# Patient Record
Sex: Male | Born: 1990 | Race: Black or African American | Hispanic: No | Marital: Single | Smoking: Never smoker
Health system: Southern US, Community
[De-identification: ages and names within clinical notes are randomized; demographics above are authoritative.]

## PROBLEM LIST (undated history)

## (undated) DIAGNOSIS — J45909 Unspecified asthma, uncomplicated: Secondary | ICD-10-CM

## (undated) DIAGNOSIS — R569 Unspecified convulsions: Secondary | ICD-10-CM

## (undated) DIAGNOSIS — L0591 Pilonidal cyst without abscess: Secondary | ICD-10-CM

## (undated) HISTORY — PX: MANDIBLE FRACTURE SURGERY: SHX706

---
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
## 2010-06-04 LAB — BASIC METABOLIC PANEL
BUN: 7 mg/dL (ref 6–23)
Chloride: 104 mEq/L (ref 96–112)
Creatinine, Ser: 0.95 mg/dL (ref 0.4–1.5)
GFR calc Af Amer: >60 mL/min (ref >60)
Glucose, Bld: 86 mg/dL (ref 70–99)
Potassium: 3.9 mEq/L (ref 3.5–5.1)
Sodium: 138 mEq/L (ref 135–145)

## 2010-06-04 LAB — DIFFERENTIAL
Eosinophils Absolute: 0 10*3/uL (ref 0.0–0.7)
Lymphocytes Relative: 16 % (ref 12–46)
Lymphs Abs: 1.4 10*3/uL (ref 0.7–4.0)
Monocytes Relative: 16 % — ABNORMAL HIGH (ref 3–12)
Neutro Abs: 5.9 10*3/uL (ref 1.7–7.7)
Neutrophils Relative %: 67 % (ref 43–77)

## 2010-06-04 LAB — CBC
Hemoglobin: 13.1 g/dL (ref 13.0–17.0)
Platelets: 193 10*3/uL (ref 150–400)
RDW: 15.7 % — ABNORMAL HIGH (ref 11.5–15.5)
WBC: 8.8 10*3/uL (ref 4.0–10.5)

## 2011-10-09 ENCOUNTER — Emergency Department (HOSPITAL_COMMUNITY)
Admission: EM | Admit: 2011-10-09 | Discharge: 2011-10-09 | Disposition: A | Discharge status: Home / Self Care | Payer: Self-pay | Attending: Emergency Medicine | Admitting: Emergency Medicine

## 2011-10-09 ENCOUNTER — Emergency Department (HOSPITAL_COMMUNITY): Payer: Self-pay

## 2011-10-09 ENCOUNTER — Encounter (HOSPITAL_COMMUNITY): Payer: Self-pay | Admitting: Emergency Medicine

## 2011-10-09 DIAGNOSIS — R569 Unspecified convulsions: Secondary | ICD-10-CM | POA: Insufficient documentation

## 2011-10-09 DIAGNOSIS — S60221A Contusion of right hand, initial encounter: Secondary | ICD-10-CM

## 2011-10-09 DIAGNOSIS — S60229A Contusion of unspecified hand, initial encounter: Secondary | ICD-10-CM | POA: Insufficient documentation

## 2011-10-09 DIAGNOSIS — J45909 Unspecified asthma, uncomplicated: Secondary | ICD-10-CM | POA: Insufficient documentation

## 2011-10-09 DIAGNOSIS — W06XXXA Fall from bed, initial encounter: Secondary | ICD-10-CM | POA: Insufficient documentation

## 2011-10-09 HISTORY — DX: Unspecified asthma, uncomplicated: J45.909

## 2011-10-09 LAB — COMPREHENSIVE METABOLIC PANEL
ALT: 21 U/L (ref 0–53)
Alkaline Phosphatase: 69 U/L (ref 39–117)
BUN: 9 mg/dL (ref 6–23)
GFR calc Af Amer: >90 mL/min (ref >90)
Glucose, Bld: 97 mg/dL (ref 70–99)
Potassium: 3.5 mEq/L (ref 3.5–5.1)
Sodium: 141 mEq/L (ref 135–145)
Total Bilirubin: 0.3 mg/dL (ref 0.3–1.2)
Total Protein: 7 g/dL (ref 6.0–8.3)

## 2011-10-09 LAB — CBC WITH DIFFERENTIAL/PLATELET
Basophils Absolute: 0 10*3/uL (ref 0.0–0.1)
Eosinophils Absolute: 0 10*3/uL (ref 0.0–0.7)
Eosinophils Relative: 0 % (ref 0–5)
Hemoglobin: 13.3 g/dL (ref 13.0–17.0)
Lymphocytes Relative: 30 % (ref 12–46)
MCH: 27.1 pg (ref 26.0–34.0)
MCV: 82.7 fL (ref 78.0–100.0)
Monocytes Relative: 7 % (ref 3–12)
Platelets: 219 10*3/uL (ref 150–400)
RBC: 4.91 MIL/uL (ref 4.22–5.81)
WBC: 5.6 10*3/uL (ref 4.0–10.5)

## 2011-10-09 LAB — URINALYSIS, ROUTINE W REFLEX MICROSCOPIC
Glucose, UA: NEGATIVE mg/dL
Urobilinogen, UA: 0.2 mg/dL (ref 0.0–1.0)
pH: 6 (ref 5.0–8.0)

## 2011-10-09 LAB — ETHANOL: Alcohol, Ethyl (B): 103 mg/dL — ABNORMAL HIGH (ref 0–11)

## 2011-10-09 LAB — RAPID URINE DRUG SCREEN, HOSP PERFORMED
Benzodiazepines: NOT DETECTED
Opiates: NOT DETECTED
Tetrahydrocannabinol: POSITIVE — AB

## 2011-10-09 NOTE — ED Notes (Signed)
FSBS 116 PTA per EMS.
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
## 2011-10-09 NOTE — ED Notes (Signed)
Seizure pads applied to bed rails

## 2011-10-09 NOTE — ED Notes (Signed)
Report given to Jenny, receiving RN. 

## 2011-10-09 NOTE — ED Notes (Signed)
Patient transported to X-ray 

## 2011-10-09 NOTE — ED Notes (Signed)
MD at bedside. 

## 2011-10-09 NOTE — ED Notes (Signed)
21/M brought to ER by EMS for c/o grand mal seizure x 30 min PTA that lasted 2 minutes per EMS. Seizure witnessed by family per EMS. Pt AAO x 4, NAD at this time and breathing even and unlabored. Upon arrival by EMS pt was unresponsive to painful stimuli per EMS and thrashing around per EMS. Pt's significant other at bedside. Pt on cardiac monitor and SPO2 monitor. Pt has small hematoma on back left side of head s/p falling out of bed after seizure per EMS. Pt was placed back into bed by family members per EMS. Will continue to monitor pt.

## 2011-10-09 NOTE — ED Notes (Signed)
Pt has no history of seizures, twin sister has history of seizures per pt.

## 2011-10-09 NOTE — ED Notes (Signed)
CBG completed. CBG=93

## 2013-11-23 ENCOUNTER — Emergency Department (HOSPITAL_COMMUNITY)
Admission: EM | Admit: 2013-11-23 | Discharge: 2013-11-23 | Disposition: A | Discharge status: Home / Self Care | Payer: Self-pay | Attending: Emergency Medicine | Admitting: Emergency Medicine

## 2013-11-23 ENCOUNTER — Encounter (HOSPITAL_COMMUNITY): Payer: Self-pay | Admitting: Emergency Medicine

## 2013-11-23 DIAGNOSIS — S60469A Insect bite (nonvenomous) of unspecified finger, initial encounter: Secondary | ICD-10-CM | POA: Insufficient documentation

## 2013-11-23 DIAGNOSIS — J45909 Unspecified asthma, uncomplicated: Secondary | ICD-10-CM | POA: Insufficient documentation

## 2013-11-23 DIAGNOSIS — Y9389 Activity, other specified: Secondary | ICD-10-CM | POA: Insufficient documentation

## 2013-11-23 DIAGNOSIS — S60569A Insect bite (nonvenomous) of unspecified hand, initial encounter: Principal | ICD-10-CM

## 2013-11-23 DIAGNOSIS — L089 Local infection of the skin and subcutaneous tissue, unspecified: Secondary | ICD-10-CM | POA: Insufficient documentation

## 2013-11-23 DIAGNOSIS — Y9289 Other specified places as the place of occurrence of the external cause: Secondary | ICD-10-CM | POA: Insufficient documentation

## 2013-11-23 DIAGNOSIS — W57XXXA Bitten or stung by nonvenomous insect and other nonvenomous arthropods, initial encounter: Secondary | ICD-10-CM | POA: Insufficient documentation

## 2013-11-23 DIAGNOSIS — F17200 Nicotine dependence, unspecified, uncomplicated: Secondary | ICD-10-CM | POA: Insufficient documentation

## 2013-11-23 MED ORDER — CEPHALEXIN 500 MG PO CAPS: 500.0000 mg | ORAL_CAPSULE | Freq: Four times a day (QID) | ORAL | Status: DC

## 2013-11-23 MED ORDER — IBUPROFEN 800 MG PO TABS: 800.0000 mg | ORAL_TABLET | Freq: Three times a day (TID) | ORAL | Status: DC

## 2013-11-23 MED ORDER — DIPHENHYDRAMINE HCL 25 MG PO TABS: 25.0000 mg | ORAL_TABLET | Freq: Four times a day (QID) | ORAL | Status: DC | PRN

## 2013-11-23 MED ORDER — DIPHENHYDRAMINE HCL 25 MG PO CAPS
50.0000 mg | ORAL_CAPSULE | Freq: Once | ORAL | Status: AC
  Administered 2013-11-23: 50 mg via ORAL
  Filled 2013-11-23: qty 2

## 2013-11-23 MED ORDER — IBUPROFEN 400 MG PO TABS
800.0000 mg | ORAL_TABLET | Freq: Once | ORAL | Status: AC
  Administered 2013-11-23: 800 mg via ORAL
  Filled 2013-11-23: qty 2

## 2013-11-23 NOTE — Discharge Planning (Signed)
[facility]ia[facility] was unable to see the patient, [facility] information will be sent to the address provided.

## 2013-11-23 NOTE — Discharge Instructions (Signed)

## 2013-11-23 NOTE — ED Notes (Signed)
Pt states he put on a work glove last pm, felt a "burning sensation" to right right finger at knuckle. Took off glove and noticed "burn mark" on knuckle. Now finger and hand are swollen and tender.

## 2013-11-23 NOTE — ED Provider Notes (Signed)
Medical screening examination/treatment/procedure(s) were performed by non-physician practitioner and as supervising physician I was immediately available for consultation/collaboration.   EKG Interpretation None       Miraj Truss, MD 11/23/13 1233 

## 2013-11-23 NOTE — ED Provider Notes (Signed)
CSN: [id number]     Arrival date & time 11/23/13  0904 History  This chart was scribed for non-physician practitioner, Roxy Horseman, PA-C working with Vanetta Mulders, MD by Greggory Stallion, ED scribe. This patient was seen in room TR05C/TR05C and the patient's care was started at 9:14 AM.    Chief Complaint  Patient presents with  . Hand Pain   The history is provided by the patient. No language interpreter was used.   HPI Comments: Alejandro Elliott is a 23 y.o. male who presents to the Emergency Department complaining of worsening right fourth finger pain with associated swelling that goes into his hand that started last night. Reports mild itching. Pt states he went to put his work glove on and felt a burning sensation to his knuckle. He thinks he might have gotten bit by a spider but is unsure. No SOB or chest pain. Denies history of similar symptoms.   Past Medical History  Diagnosis Date  . Asthma    Past Surgical History  Procedure Laterality Date  . Mandible fracture surgery     Family History  Problem Relation Age of Onset  . Seizures Sister    History  Substance Use Topics  . Smoking status: Current Every Day Smoker  . Smokeless tobacco: Never Used  . Alcohol Use: Yes    Review of Systems  Constitutional: Negative for fever.  HENT: Negative for congestion.   Eyes: Negative for redness.  Respiratory: Negative for shortness of breath.   Cardiovascular: Negative for chest pain.  Gastrointestinal: Negative for abdominal distention.  Musculoskeletal: Positive for arthralgias and joint swelling.  Skin: Negative for rash.  Neurological: Negative for speech difficulty.  Psychiatric/Behavioral: Negative for confusion.   Allergies  Review of patient's allergies indicates no known allergies.  Home Medications   Prior to Admission medications   Medication Sig Start Date End Date Taking? Authorizing Provider  albuterol (PROVENTIL HFA;VENTOLIN HFA) 108 (90 BASE) MCG/ACT  inhaler Inhale 2 puffs into the lungs every 6 (six) hours as needed.    Historical Provider, MD   BP 132/80  Pulse 66  Temp(Src) 97.6 F (36.4 C) (Oral)  Resp 20  SpO2 100%  Physical Exam  Nursing note and vitals reviewed. Constitutional: He is oriented to person, place, and time. He appears well-developed. No distress.  HENT:  Head: Normocephalic and atraumatic.  Eyes: Conjunctivae and EOM are normal.  Cardiovascular: Normal rate and regular rhythm.   Pulmonary/Chest: Effort normal. No stridor. No respiratory distress.  Abdominal: He exhibits no distension.  Musculoskeletal: He exhibits no edema.  Right ring finger and right hand moderately swollen. Non tender to palpation. No bony abnormality or deformity. Right finger flexion is limited secondary to swelling. Extension is intact.   Neurological: He is alert and oriented to person, place, and time.  Sensation and strength intact.  Skin: Skin is warm and dry.  No obvious punctures or wounds. No sign of cellulitis.   Psychiatric: He has a normal mood and affect.    ED Course  Procedures (including critical care time)  DIAGNOSTIC STUDIES: Oxygen Saturation is 100% on RA, normal by my interpretation.    COORDINATION OF CARE: 9:19 AM-Discussed treatment plan which includes benadryl, an anti-inflammatory and an antibiotic with pt at bedside and pt agreed to plan. Advised ice and elevation. Return precautions given.   Labs Review Labs Reviewed - No data to display  Imaging Review No results found.   EKG Interpretation None  MDM   Final diagnoses:  Insect bite    Patient with insect bite.  Describes it as a stinging/burning.  Bees were present at the time of the bite.  Suspect bee sting, but patient believes it was a spider.  No obvious puncture wounds or ulcerations.  Will treat with benadryl, nsaids, and give keflex for prophylaxis.  I personally performed the services described in this documentation, which was  scribed in my presence. The recorded information has been reviewed and is accurate.  Roxy Horseman, PA-C 11/23/13 [phone]

## 2013-11-28 ENCOUNTER — Encounter (HOSPITAL_COMMUNITY): Payer: Self-pay | Admitting: Emergency Medicine

## 2013-11-28 ENCOUNTER — Emergency Department (HOSPITAL_COMMUNITY): Payer: Self-pay

## 2013-11-28 ENCOUNTER — Emergency Department (HOSPITAL_COMMUNITY)
Admission: EM | Admit: 2013-11-28 | Discharge: 2013-11-28 | Disposition: A | Discharge status: Home / Self Care | Payer: Self-pay | Attending: Emergency Medicine | Admitting: Emergency Medicine

## 2013-11-28 DIAGNOSIS — Z791 Long term (current) use of non-steroidal anti-inflammatories (NSAID): Secondary | ICD-10-CM | POA: Insufficient documentation

## 2013-11-28 DIAGNOSIS — S139XXA Sprain of joints and ligaments of unspecified parts of neck, initial encounter: Secondary | ICD-10-CM | POA: Insufficient documentation

## 2013-11-28 DIAGNOSIS — R071 Chest pain on breathing: Secondary | ICD-10-CM | POA: Insufficient documentation

## 2013-11-28 DIAGNOSIS — F17200 Nicotine dependence, unspecified, uncomplicated: Secondary | ICD-10-CM | POA: Insufficient documentation

## 2013-11-28 DIAGNOSIS — Z79899 Other long term (current) drug therapy: Secondary | ICD-10-CM | POA: Insufficient documentation

## 2013-11-28 DIAGNOSIS — J45909 Unspecified asthma, uncomplicated: Secondary | ICD-10-CM | POA: Insufficient documentation

## 2013-11-28 DIAGNOSIS — Z792 Long term (current) use of antibiotics: Secondary | ICD-10-CM | POA: Insufficient documentation

## 2013-11-28 DIAGNOSIS — R0789 Other chest pain: Secondary | ICD-10-CM

## 2013-11-28 DIAGNOSIS — S161XXA Strain of muscle, fascia and tendon at neck level, initial encounter: Secondary | ICD-10-CM

## 2013-11-28 DIAGNOSIS — S0993XA Unspecified injury of face, initial encounter: Secondary | ICD-10-CM | POA: Insufficient documentation

## 2013-11-28 DIAGNOSIS — S199XXA Unspecified injury of neck, initial encounter: Secondary | ICD-10-CM

## 2013-11-28 MED ORDER — IBUPROFEN 600 MG PO TABS: 600.0000 mg | ORAL_TABLET | Freq: Four times a day (QID) | ORAL | Status: DC | PRN

## 2013-11-28 MED ORDER — HYDROCODONE-ACETAMINOPHEN 5-325 MG PO TABS: 1.0000 | ORAL_TABLET | Freq: Four times a day (QID) | ORAL | Status: DC | PRN

## 2013-11-28 MED ORDER — KETOROLAC TROMETHAMINE 60 MG/2ML IM SOLN
60.0000 mg | Freq: Once | INTRAMUSCULAR | Status: AC
  Administered 2013-11-28: 60 mg via INTRAMUSCULAR
  Filled 2013-11-28: qty 2

## 2013-11-28 NOTE — Discharge Instructions (Signed)
Cervical Sprain A cervical sprain is when the tissues (ligaments) that hold the neck bones in place stretch or tear. HOME CARE   Put ice on the injured area.  Put ice in a plastic bag.  Place a towel between your skin and the bag.  Leave the ice on for 15-20 minutes, 3-4 times a day.  You may have been given a collar to wear. This collar keeps your neck from moving while you heal.  Do not take the collar off unless told by your doctor.  If you have long hair, keep it outside of the collar.  Ask your doctor before changing the position of your collar. You may need to change its position over time to make it more comfortable.  If you are allowed to take off the collar for cleaning or bathing, follow your doctor's instructions on how to do it safely.  Keep your collar clean by wiping it with mild soap and water. Dry it completely. If the collar has removable pads, remove them every 1-2 days to hand wash them with soap and water. Allow them to air dry. They should be dry before you wear them in the collar.  Do not drive while wearing the collar.  Only take medicine as told by your doctor.  Keep all doctor visits as told.  Keep all physical therapy visits as told.  Adjust your work station so that you have good posture while you work.  Avoid positions and activities that make your problems worse.  Warm up and stretch before being active. GET HELP IF:  Your pain is not controlled with medicine.  You cannot take less pain medicine over time as planned.  Your activity level does not improve as expected. GET HELP RIGHT AWAY IF:   You are bleeding.  Your stomach is upset.  You have an allergic reaction to your medicine.  You develop new problems that you cannot explain.  You lose feeling (become numb) or you cannot move any part of your body (paralysis).  You have tingling or weakness in any part of your body.  Your symptoms get worse. Symptoms include:  Pain,  soreness, stiffness, puffiness (swelling), or a burning feeling in your neck.  Pain when your neck is touched.  Shoulder or upper back pain.  Limited ability to move your neck.  Headache.  Dizziness.  Your hands or arms feel week, lose feeling, or tingle.  Muscle spasms.  Difficulty swallowing or chewing. MAKE SURE YOU:   Understand these instructions.  Will watch your condition.  Will get help right away if you are not doing well or get worse. Document Released: 08/20/2007 Document Revised: 11/03/2012 Document Reviewed: 09/08/2012 [facility] Patient Information 2015 [place], [place]. This information is not intended to replace advice given to you by your health care provider. Make sure you discuss any questions you have with your health care provider. Chest Wall Pain Chest wall pain is pain in or around the bones and muscles of your chest. It may take up to 6 weeks to get better. It may take longer if you must stay physically active in your work and activities.  CAUSES  Chest wall pain may happen on its own. However, it may be caused by:  A viral illness like the flu.  Injury.  Coughing.  Exercise.  Arthritis.  Fibromyalgia.  Shingles. HOME CARE INSTRUCTIONS   Avoid overtiring physical activity. Try not to strain or perform activities that cause pain. This includes any activities using your chest  or your abdominal and side muscles, especially if heavy weights are used.  Put ice on the sore area.  Put ice in a plastic bag.  Place a towel between your skin and the bag.  Leave the ice on for 15-20 minutes per hour while awake for the first 2 days.  Only take over-the-counter or prescription medicines for pain, discomfort, or fever as directed by your caregiver. SEEK IMMEDIATE MEDICAL CARE IF:   Your pain increases, or you are very uncomfortable.  You have a fever.  Your chest pain becomes worse.  You have new, unexplained symptoms.  You have nausea or  vomiting.  You feel sweaty or lightheaded.  You have a cough with phlegm (sputum), or you cough up blood. MAKE SURE YOU:   Understand these instructions.  Will watch your condition.  Will get help right away if you are not doing well or get worse. Document Released: 03/03/2005 Document Revised: 05/26/2011 Document Reviewed: 10/28/2010 [facility] Patient Information 2015 [place], [place]. This information is not intended to replace advice given to you by your health care provider. Make sure you discuss any questions you have with your health care provider.
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
## 2013-12-11 ENCOUNTER — Emergency Department (HOSPITAL_COMMUNITY): Payer: Self-pay

## 2013-12-11 ENCOUNTER — Emergency Department (HOSPITAL_COMMUNITY)
Admission: EM | Admit: 2013-12-11 | Discharge: 2013-12-11 | Disposition: A | Discharge status: Home / Self Care | Payer: Self-pay | Attending: Emergency Medicine | Admitting: Emergency Medicine

## 2013-12-11 ENCOUNTER — Encounter (HOSPITAL_COMMUNITY): Payer: Self-pay | Admitting: Emergency Medicine

## 2013-12-11 DIAGNOSIS — Z79899 Other long term (current) drug therapy: Secondary | ICD-10-CM | POA: Insufficient documentation

## 2013-12-11 DIAGNOSIS — R079 Chest pain, unspecified: Secondary | ICD-10-CM | POA: Insufficient documentation

## 2013-12-11 DIAGNOSIS — J45909 Unspecified asthma, uncomplicated: Secondary | ICD-10-CM | POA: Insufficient documentation

## 2013-12-11 DIAGNOSIS — F17200 Nicotine dependence, unspecified, uncomplicated: Secondary | ICD-10-CM | POA: Insufficient documentation

## 2013-12-11 DIAGNOSIS — R071 Chest pain on breathing: Secondary | ICD-10-CM | POA: Insufficient documentation

## 2013-12-11 DIAGNOSIS — Z792 Long term (current) use of antibiotics: Secondary | ICD-10-CM | POA: Insufficient documentation

## 2013-12-11 DIAGNOSIS — R0789 Other chest pain: Secondary | ICD-10-CM

## 2013-12-11 MED ORDER — TRAMADOL HCL 50 MG PO TABS: 50.0000 mg | ORAL_TABLET | Freq: Four times a day (QID) | ORAL | Status: DC | PRN

## 2013-12-11 MED ORDER — IBUPROFEN 800 MG PO TABS
800.0000 mg | ORAL_TABLET | Freq: Three times a day (TID) | ORAL | Status: DC
Start: 2013-12-11 — End: 2013-12-16
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
## 2013-12-11 NOTE — Discharge Instructions (Signed)
Please follow up with your primary care physician in 1-2 days. If you do not have one please call the [facility] number listed above. Please take pain medication and/or muscle relaxants as prescribed and as needed for pain. Please do not drive on narcotic pain medication or on muscle relaxants. Please read all discharge instructions and return precautions.  ° ° °Chest Wall Pain °Chest wall pain is pain in or around the bones and muscles of your chest. It may take up to 6 weeks to get better. It may take longer if you must stay physically active in your work and activities.  °CAUSES  °Chest wall pain may happen on its own. However, it may be caused by: °· A viral illness like the flu. °· Injury. °· Coughing. °· Exercise. °· Arthritis. °· Fibromyalgia. °· Shingles. °HOME CARE INSTRUCTIONS  °· Avoid overtiring physical activity. Try not to strain or perform activities that cause pain. This includes any activities using your chest or your abdominal and side muscles, especially if heavy weights are used. °· Put ice on the sore area. °¨ Put ice in a plastic bag. °¨ Place a towel between your skin and the bag. °¨ Leave the ice on for 15-20 minutes per hour while awake for the first 2 days. °· Only take over-the-counter or prescription medicines for pain, discomfort, or fever as directed by your caregiver. °SEEK IMMEDIATE MEDICAL CARE IF:  °· Your pain increases, or you are very uncomfortable. °· You have a fever. °· Your chest pain becomes worse. °· You have new, unexplained symptoms. °· You have nausea or vomiting. °· You feel sweaty or lightheaded. °· You have a cough with phlegm (sputum), or you cough up blood. °MAKE SURE YOU:  °· Understand these instructions. °· Will watch your condition. °· Will get help right away if you are not doing well or get worse. °Document Released: 03/03/2005 Document Revised: 05/26/2011 Document Reviewed: 10/28/2010 °ExitCare® Patient Information ©2015 [employer].  This information is not intended to replace advice given to you by your health care provider. Make sure you discuss any questions you have with your health care provider. ° °

## 2013-12-11 NOTE — ED Provider Notes (Signed)
Medical screening examination/treatment/procedure(s) were performed by non-physician practitioner and as supervising physician I was immediately available for consultation/collaboration.   EKG Interpretation None        Dorena Dorfman F Tayquan Gassman, MD 12/11/13 1943 
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
## 2013-12-16 ENCOUNTER — Encounter (HOSPITAL_COMMUNITY): Payer: Self-pay | Admitting: Emergency Medicine

## 2013-12-16 ENCOUNTER — Emergency Department (HOSPITAL_COMMUNITY)
Admission: EM | Admit: 2013-12-16 | Discharge: 2013-12-16 | Disposition: A | Discharge status: Home / Self Care | Payer: Self-pay | Attending: Emergency Medicine | Admitting: Emergency Medicine

## 2013-12-16 DIAGNOSIS — L0231 Cutaneous abscess of buttock: Secondary | ICD-10-CM | POA: Insufficient documentation

## 2013-12-16 DIAGNOSIS — J45909 Unspecified asthma, uncomplicated: Secondary | ICD-10-CM | POA: Insufficient documentation

## 2013-12-16 DIAGNOSIS — Z720 Tobacco use: Secondary | ICD-10-CM | POA: Insufficient documentation

## 2013-12-16 DIAGNOSIS — Z792 Long term (current) use of antibiotics: Secondary | ICD-10-CM | POA: Insufficient documentation

## 2013-12-16 DIAGNOSIS — Z79899 Other long term (current) drug therapy: Secondary | ICD-10-CM | POA: Insufficient documentation

## 2013-12-16 MED ORDER — HYDROCODONE-ACETAMINOPHEN 5-325 MG PO TABS: 1.0000 | ORAL_TABLET | ORAL | Status: DC | PRN

## 2013-12-16 MED ORDER — LIDOCAINE-EPINEPHRINE (PF) 2 %-1:200000 IJ SOLN
10.0000 mL | Freq: Once | INTRAMUSCULAR | Status: AC
  Administered 2013-12-16: 10 mL via INTRADERMAL
  Filled 2013-12-16: qty 20

## 2013-12-16 MED ORDER — OXYCODONE-ACETAMINOPHEN 5-325 MG PO TABS
2.0000 | ORAL_TABLET | Freq: Once | ORAL | Status: AC
  Administered 2013-12-16: 2 via ORAL
  Filled 2013-12-16: qty 2

## 2013-12-16 MED ORDER — SULFAMETHOXAZOLE-TRIMETHOPRIM 800-160 MG PO TABS: 1.0000 | ORAL_TABLET | Freq: Two times a day (BID) | ORAL | Status: AC

## 2013-12-16 NOTE — ED Provider Notes (Signed)
CSN: [id number]     Arrival date & time 12/16/13  1248 History  This chart was scribed for non-physician practitioner, Johnnette Gourdobyn Albert, PA-C, working with Warnell Foresterrey Wofford, MD by Charline BillsEssence Howell, ED Scribe. This patient was seen in room TR10C/TR10C and the patient's care was started at 1:33 PM.   Chief Complaint  Patient presents with  . Abscess   The history is provided by the patient. No language interpreter was used.   HPI Comments: Alejandro Elliott is a 23 y.o. male, with a h/o abscesses, who presents to the Emergency Department complaining of growing abscess noted to lower back 3 days ago. He denies fever. Pt reports h/o abscess to the same area which required I&D and antibiotics. Pt states that abscess returns every 2-3 months. Pt has applied warm compresses and bathed with antibacterial soap. No PCP.  Past Medical History  Diagnosis Date  . Asthma    Past Surgical History  Procedure Laterality Date  . Mandible fracture surgery     Family History  Problem Relation Age of Onset  . Seizures Sister    History  Substance Use Topics  . Smoking status: Current Every Day Smoker    Types: Cigarettes  . Smokeless tobacco: Never Used  . Alcohol Use: Yes    Review of Systems  Constitutional: Negative for fever.  Skin:       + Abscess  All other systems reviewed and are negative.  Allergies  Review of patient's allergies indicates no known allergies.  Home Medications   Prior to Admission medications   Medication Sig Start Date End Date Taking? Authorizing Provider  albuterol (PROVENTIL HFA;VENTOLIN HFA) 108 (90 BASE) MCG/ACT inhaler Inhale 2 puffs into the lungs every 6 (six) hours as needed for wheezing or shortness of breath.     Historical Provider, MD  cephALEXin (KEFLEX) 500 MG capsule Take 1 capsule (500 mg total) by mouth 4 (four) times daily. 11/23/13   Roxy Horsemanobert Browning, PA-C  diphenhydrAMINE (BENADRYL) 25 MG tablet Take 1 tablet (25 mg total) by mouth every 6 (six) hours as needed  for itching (Rash). 11/23/13   Roxy Horsemanobert Browning, PA-C  HYDROcodone-acetaminophen (NORCO/VICODIN) 5-325 MG per tablet Take 1 tablet by mouth every 6 (six) hours as needed for moderate pain or severe pain. 11/28/13   Shon Batonourtney F Horton, MD  HYDROcodone-acetaminophen (NORCO/VICODIN) 5-325 MG per tablet Take 1-2 tablets by mouth every 4 (four) hours as needed. 12/16/13   Alejandro Maceobyn M Albert, PA-C  ibuprofen (ADVIL,MOTRIN) 600 MG tablet Take 1 tablet (600 mg total) by mouth every 6 (six) hours as needed. 11/28/13   Shon Batonourtney F Horton, MD  ibuprofen (ADVIL,MOTRIN) 800 MG tablet Take 1 tablet (800 mg total) by mouth 3 (three) times daily. 11/23/13   Roxy Horsemanobert Browning, PA-C  ibuprofen (ADVIL,MOTRIN) 800 MG tablet Take 1 tablet (800 mg total) by mouth 3 (three) times daily. 12/11/13   Jennifer L Piepenbrink, PA-C  sulfamethoxazole-trimethoprim (BACTRIM DS,SEPTRA DS) 800-160 MG per tablet Take 1 tablet by mouth 2 (two) times daily. 12/16/13 12/23/13  Alejandro Maceobyn M Albert, PA-C  traMADol (ULTRAM) 50 MG tablet Take 1 tablet (50 mg total) by mouth every 6 (six) hours as needed. 12/11/13   Jennifer L Piepenbrink, PA-C   Triage Vitals: BP 124/71  Pulse 90  Temp(Src) 98.3 F (36.8 C)  Resp 18  SpO2 99% Physical Exam  Nursing note and vitals reviewed. Constitutional: He is oriented to person, place, and time. He appears well-developed and well-nourished. No distress.  HENT:  Head:  Normocephalic and atraumatic.  Eyes: Conjunctivae and EOM are normal.  Neck: Normal range of motion. Neck supple.  Cardiovascular: Normal rate, regular rhythm and normal heart sounds.   Pulmonary/Chest: Effort normal and breath sounds normal.  Musculoskeletal: Normal range of motion. He exhibits no edema.  Neurological: He is alert and oriented to person, place, and time.  Skin: Skin is warm and dry.     Psychiatric: He has a normal mood and affect. His behavior is normal.   ED Course  Procedures (including critical care time) INCISION AND  DRAINAGE Performed by: Johnnette Gourd Consent: Verbal consent obtained. Risks and benefits: risks, benefits and alternatives were discussed Type: abscess  Body area: right buttock  Anesthesia: local infiltration  Incision was made with a scalpel.  Local anesthetic: lidocaine 2% with epinephrine  Anesthetic total: 2 ml  Complexity: complex Blunt dissection to break up loculations  Drainage: purulent  Drainage amount: large  Packing material: none  Patient tolerance: Patient tolerated the procedure well with no immediate complications.    DIAGNOSTIC STUDIES: Oxygen Saturation is 99% on RA, normal by my interpretation.    COORDINATION OF CARE: 1:35 PM-Discussed treatment plan which includes I&D with pt at bedside and pt agreed to plan.   Labs Review Labs Reviewed - No data to display  Imaging Review No results found.   EKG Interpretation None      MDM   Final diagnoses:  Abscess of right buttock   Patient well-appearing in no apparent distress. Afebrile, vital signs stable. Abscess drained. Will discharge home with pain medication and Bactrim. Infection care/precautions discussed. Resources given for PCP followup. Stable for discharge. Return precautions given. Patient states understanding of treatment care plan and is agreeable.  I personally performed the services described in this documentation, which was scribed in my presence. The recorded information has been reviewed and is accurate.    Alejandro Mace, PA-C 12/16/13 1429

## 2013-12-16 NOTE — Discharge Instructions (Signed)
Take Vicodin for severe pain only. No driving or operating heavy machinery while taking vicodin. This medication may cause drowsiness. Take antibiotic to completion.  Abscess An abscess is an infected area that contains a collection of pus and debris.It can occur in almost any part of the body. An abscess is also known as a furuncle or boil. CAUSES  An abscess occurs when tissue gets infected. This can occur from blockage of oil or sweat glands, infection of hair follicles, or a minor injury to the skin. As the body tries to fight the infection, pus collects in the area and creates pressure under the skin. This pressure causes pain. People with weakened immune systems have difficulty fighting infections and get certain abscesses more often.  SYMPTOMS Usually an abscess develops on the skin and becomes a painful mass that is red, warm, and tender. If the abscess forms under the skin, you may feel a moveable soft area under the skin. Some abscesses break open (rupture) on their own, but most will continue to get worse without care. The infection can spread deeper into the body and eventually into the bloodstream, causing you to feel ill.  DIAGNOSIS  Your caregiver will take your medical history and perform a physical exam. A sample of fluid may also be taken from the abscess to determine what is causing your infection. TREATMENT  Your caregiver may prescribe antibiotic medicines to fight the infection. However, taking antibiotics alone usually does not cure an abscess. Your caregiver may need to make a small cut (incision) in the abscess to drain the pus. In some cases, gauze is packed into the abscess to reduce pain and to continue draining the area. HOME CARE INSTRUCTIONS   Only take over-the-counter or prescription medicines for pain, discomfort, or fever as directed by your caregiver.  If you were prescribed antibiotics, take them as directed. Finish them even if you start to feel better.  If  gauze is used, follow your caregiver's directions for changing the gauze.  To avoid spreading the infection:  Keep your draining abscess covered with a bandage.  Wash your hands well.  Do not share personal care items, towels, or whirlpools with others.  Avoid skin contact with others.  Keep your skin and clothes clean around the abscess.  Keep all follow-up appointments as directed by your caregiver. SEEK MEDICAL CARE IF:   You have increased pain, swelling, redness, fluid drainage, or bleeding.  You have muscle aches, chills, or a general ill feeling.  You have a fever. MAKE SURE YOU:   Understand these instructions.  Will watch your condition.  Will get help right away if you are not doing well or get worse. Document Released: 12/11/2004 Document Revised: 09/02/2011 Document Reviewed: 05/16/2011 [facility] Patient Information 2015 [place], [place]. This information is not intended to replace advice given to you by your health care provider. Make sure you discuss any questions you have with your health care provider.  Abscess Care After An abscess (also called a boil or furuncle) is an infected area that contains a collection of pus. Signs and symptoms of an abscess include pain, tenderness, redness, or hardness, or you may feel a moveable soft area under your skin. An abscess can occur anywhere in the body. The infection may spread to surrounding tissues causing cellulitis. A cut (incision) by the surgeon was made over your abscess and the pus was drained out. Gauze may have been packed into the space to provide a drain that will allow the  cavity to heal from the inside outwards. The boil may be painful for 5 to 7 days. Most people with a boil do not have high fevers. Your abscess, if seen early, may not have localized, and may not have been lanced. If not, another appointment may be required for this if it does not get better on its own or with medications. HOME CARE INSTRUCTIONS     Only take over-the-counter or prescription medicines for pain, discomfort, or fever as directed by your caregiver.  When you bathe, soak and then remove gauze or iodoform packs at least daily or as directed by your caregiver. You may then wash the wound gently with mild soapy water. Repack with gauze or do as your caregiver directs. SEEK IMMEDIATE MEDICAL CARE IF:   You develop increased pain, swelling, redness, drainage, or bleeding in the wound site.  You develop signs of generalized infection including muscle aches, chills, fever, or a general ill feeling.  An oral temperature above 102 F (38.9 C) develops, not controlled by medication. See your caregiver for a recheck if you develop any of the symptoms described above. If medications (antibiotics) were prescribed, take them as directed. Document Released: 09/19/2004 Document Revised: 05/26/2011 Document Reviewed: 05/17/2007 [facility] Patient Information 2015 [place]. This information is not intended to replace advice given to you by your health care provider. Make sure you discuss any questions you have with your health care provider.

## 2013-12-16 NOTE — ED Notes (Signed)
Top of butt has a bump about quarter size has a  Head on it hurts to sit

## 2013-12-16 NOTE — ED Notes (Signed)
Per pt sts 3 days of abscess to lower back area. sts hx of same. Denies drainage.

## 2013-12-17 NOTE — ED Provider Notes (Signed)
Medical screening examination/treatment/procedure(s) were performed by non-physician practitioner and as supervising physician I was immediately available for consultation/collaboration.   Warnell Foresterrey Ethne Jeon, MD 12/17/13 [phone]

## 2014-02-05 ENCOUNTER — Encounter (HOSPITAL_COMMUNITY): Payer: Self-pay | Admitting: Emergency Medicine

## 2014-02-05 ENCOUNTER — Emergency Department (HOSPITAL_COMMUNITY)
Admission: EM | Admit: 2014-02-05 | Discharge: 2014-02-05 | Disposition: A | Discharge status: Home / Self Care | Payer: Self-pay | Attending: Emergency Medicine | Admitting: Emergency Medicine

## 2014-02-05 DIAGNOSIS — L0501 Pilonidal cyst with abscess: Secondary | ICD-10-CM | POA: Insufficient documentation

## 2014-02-05 DIAGNOSIS — Z720 Tobacco use: Secondary | ICD-10-CM | POA: Insufficient documentation

## 2014-02-05 DIAGNOSIS — J45909 Unspecified asthma, uncomplicated: Secondary | ICD-10-CM | POA: Insufficient documentation

## 2014-02-05 DIAGNOSIS — Z79899 Other long term (current) drug therapy: Secondary | ICD-10-CM | POA: Insufficient documentation

## 2014-02-05 MED ORDER — LIDOCAINE-EPINEPHRINE (PF) 2 %-1:200000 IJ SOLN
20.0000 mL | Freq: Once | INTRAMUSCULAR | Status: AC
  Administered 2014-02-05: 10 mL
  Filled 2014-02-05: qty 20

## 2014-02-05 MED ORDER — SULFAMETHOXAZOLE-TRIMETHOPRIM 800-160 MG PO TABS: 1.0000 | ORAL_TABLET | Freq: Two times a day (BID) | ORAL | Status: AC

## 2014-02-05 MED ORDER — HYDROCODONE-ACETAMINOPHEN 5-325 MG PO TABS: 1.0000 | ORAL_TABLET | ORAL | Status: DC | PRN

## 2014-02-05 MED ORDER — IBUPROFEN 800 MG PO TABS: 800.0000 mg | ORAL_TABLET | Freq: Three times a day (TID) | ORAL | Status: DC | PRN

## 2014-02-05 MED ORDER — IBUPROFEN 800 MG PO TABS
800.0000 mg | ORAL_TABLET | Freq: Once | ORAL | Status: AC
  Administered 2014-02-05: 800 mg via ORAL
  Filled 2014-02-05: qty 1

## 2014-02-05 MED ORDER — HYDROMORPHONE HCL 1 MG/ML IJ SOLN
2.0000 mg | Freq: Once | INTRAMUSCULAR | Status: AC
  Administered 2014-02-05: 2 mg via INTRAMUSCULAR
  Filled 2014-02-05: qty 2

## 2014-02-05 NOTE — ED Notes (Signed)
Pt. Stated, I have an abscess on edge of buttocks for 4 days. I've had this several times.

## 2014-02-05 NOTE — ED Provider Notes (Signed)
CSN: [id number]     Arrival date & time 02/05/14  1018 History   First MD Initiated Contact with Patient 02/05/14 1045     Chief Complaint  Patient presents with  . Abscess     (Consider location/radiation/quality/duration/timing/severity/associated sxs/prior Treatment) The history is provided by the patient and medical records.     Pt presents with buttock abscess.  States he has been here 3-4 times in the past 6 months with abscess in the exact same place.  States that it is always cut open, he goes home on antibiotics and then it comes back.  This current episode has been ongoing x 3 days.  Has localized pain.  Denies fever, drainage, change in stools, pain anywhere else.  Otherwise feels well.  Has never been referred to a surgeon to his knowledge.   Past Medical History  Diagnosis Date  . Asthma    Past Surgical History  Procedure Laterality Date  . Mandible fracture surgery     Family History  Problem Relation Age of Onset  . Seizures Sister    History  Substance Use Topics  . Smoking status: Current Every Day Smoker    Types: Cigarettes  . Smokeless tobacco: Never Used  . Alcohol Use: Yes    Review of Systems  All other systems reviewed and are negative.     Allergies  Review of patient's allergies indicates no known allergies.  Home Medications   Prior to Admission medications   Medication Sig Start Date End Date Taking? Authorizing Provider  albuterol (PROVENTIL HFA;VENTOLIN HFA) 108 (90 BASE) MCG/ACT inhaler Inhale 2 puffs into the lungs every 6 (six) hours as needed for wheezing or shortness of breath.     Historical Provider, MD  HYDROcodone-acetaminophen (NORCO/VICODIN) 5-325 MG per tablet Take 1 tablet by mouth every 6 (six) hours as needed for moderate pain or severe pain. 11/28/13   Shon Batonourtney F Horton, MD  HYDROcodone-acetaminophen (NORCO/VICODIN) 5-325 MG per tablet Take 1-2 tablets by mouth every 4 (four) hours as needed. 12/16/13   Robyn M Hess, PA-C   ibuprofen (ADVIL,MOTRIN) 800 MG tablet Take 800 mg by mouth every 8 (eight) hours as needed for mild pain or moderate pain.    Historical Provider, MD  traMADol (ULTRAM) 50 MG tablet Take 50 mg by mouth every 6 (six) hours as needed for moderate pain.    Historical Provider, MD   BP 129/67 mmHg  Pulse 86  Temp(Src) 98 F (36.7 C) (Oral)  Resp 20  SpO2 98% Physical Exam  Constitutional: He appears well-developed and well-nourished. No distress.  HENT:  Head: Normocephalic and atraumatic.  Neck: Neck supple.  Pulmonary/Chest: Effort normal.  Neurological: He is alert.  Skin: He is not diaphoretic.     Nursing note and vitals reviewed.   ED Course  Procedures (including critical care time) Labs Review Labs Reviewed - No data to display  Imaging Review No results found.   EKG Interpretation None       INCISION AND DRAINAGE Performed by: Trixie DredgeWEST, Paiden Caraveo Consent: Verbal consent obtained. Risks and benefits: risks, benefits and alternatives were discussed Type: abscess  Body area: superior gluteal cleft, right side  Anesthesia: local infiltration  Elliptical Incision was made with a scalpel.  Local anesthetic: lidocaine 2% with epinephrine  Anesthetic total: 4 ml  Complexity: complex Blunt dissection to break up loculations  Drainage: purulent  Drainage amount: moderate  Packing material: none  Irrigated with normal saline  Patient tolerance: Patient tolerated the procedure well  with no immediate complications.     MDM   Final diagnoses:  Pilonidal abscess   Afebrile, nontoxic patient with abscess of the superior gluteal cleft.  Given location and recurrence, I suspect that this is pilonidal vs buttock, will refer to general surgery.   Did have small area of erythema overlying abscess.  D/C home with bactrim, norco, ibuprofen.  Discussed result, findings, treatment, and follow up  with patient.  Pt given return precautions.  Pt verbalizes understanding  and agrees with plan.       Trixie Dredgemily Krisinda Giovanni, PA-C 02/05/14 1303  Linwood DibblesJon Knapp, MD 02/05/14 1310

## 2014-02-05 NOTE — Discharge Instructions (Signed)
Read the information below.  Use the prescribed medication as directed.  Please discuss all new medications with your pharmacist.  Do not take additional tylenol while taking the prescribed pain medication to avoid overdose.  You may return to the Emergency Department at any time for worsening condition or any new symptoms that concern you.  If you develop redness, swelling, uncontrolled pain, or fevers greater than 100.4, return to the ER immediately for a recheck.      Incision and Drainage Incision and drainage is a procedure in which a sac-like structure (cystic structure) is opened and drained. The area to be drained usually contains material such as pus, fluid, or blood.  LET YOUR CAREGIVER KNOW ABOUT:   Allergies to medicine.  Medicines taken, including vitamins, herbs, eyedrops, over-the-counter medicines, and creams.  Use of steroids (by mouth or creams).  Previous problems with anesthetics or numbing medicines.  History of bleeding problems or blood clots.  Previous surgery.  Other health problems, including diabetes and kidney problems.  Possibility of pregnancy, if this applies. RISKS AND COMPLICATIONS  Pain.  Bleeding.  Scarring.  Infection. BEFORE THE PROCEDURE  You may need to have an ultrasound or other imaging tests to see how large or deep your cystic structure is. Blood tests may also be used to determine if you have an infection or how severe the infection is. You may need to have a tetanus shot. PROCEDURE  The affected area is cleaned with a cleaning fluid. The cyst area will then be numbed with a medicine (local anesthetic). A small incision will be made in the cystic structure. A syringe or catheter may be used to drain the contents of the cystic structure, or the contents may be squeezed out. The area will then be flushed with a cleansing solution. After cleansing the area, it is often gently packed with a gauze or another wound dressing. Once it is packed,  it will be covered with gauze and tape or some other type of wound dressing. AFTER THE PROCEDURE   Often, you will be allowed to go home right after the procedure.  You may be given antibiotic medicine to prevent or heal an infection.  If the area was packed with gauze or some other wound dressing, you will likely need to come back in 1 to 2 days to get it removed.  The area should heal in about 14 days. Document Released: 08/27/2000 Document Revised: 09/02/2011 Document Reviewed: 04/28/2011 [facility] Patient Information 2015 [place], [place]. This information is not intended to replace advice given to you by your health care provider. Make sure you discuss any questions you have with your health care provider.  Pilonidal Cyst, Care After A pilonidal cyst occurs when hairs get trapped (ingrown) beneath the skin in the crease between the buttocks over your sacrum (the bone under that crease). Pilonidal cysts are most common in young men with a lot of body hair. When the cyst breaks(ruptured) or leaks, fluid from the cyst may cause burning and itching. If the cyst becomes infected, it causes a painful swelling filled with pus (abscess). The pus and trapped hairs need to be removed (often by lancing) so that the infection can heal. The word pilonidal means hair nest. HOME CARE INSTRUCTIONS If the pilonidal sinus was NOT DRAINING OR LANCED:  Keep the area clean and dry. Bathe or shower daily. Wash the area well with a germ-killing soap. Hot tub baths may help prevent infection. Dry the area well with a towel.  Avoid tight clothing in order to keep area as moisture-free as possible.  Keep area between buttocks as free from hair as possible. A depilatory may be used.  Take antibiotics as directed.  Only take over-the-counter or prescription medicines for pain, discomfort, or fever as directed by your caregiver. If the cyst WAS INFECTED AND NEEDED TO BE DRAINED:  Your caregiver may have packed  the wound with gauze to keep the wound open. This allows the wound to heal from the inside outward and continue to drain.  Return as directed for a wound check.  If you take tub baths or showers, repack the wound with gauze as directed following. Sponge baths are a good alternative. Sitz baths may be used three to four times a day or as directed.  If an antibiotic was ordered to fight the infection, take as directed.  Only take over-the-counter or prescription medicines for pain, discomfort, or fever as directed by your caregiver.  If a drain was in place and removed, use sitz baths for 20 minutes 4 times per day. Clean the wound gently with mild unscented soap, pat dry, and then apply a dry dressing as directed. If you had surgery and IT WAS MARSUPIALIZED (LEFT OPEN):  Your wound was packed with gauze to keep the wound open. This allows the wound to heal from the inside outwards and continue draining. The changing of the dressing regularly also helps keep the wound clean.  Return as directed for a wound check.  If you take tub baths or showers, repack the wound with gauze as directed following. Sponge baths are a good alternative. Sitz baths can also be used. This may be done three to four times a day or as directed.  If an antibiotic was ordered to fight the infection, take as directed.  Only take over-the-counter or prescription medicines for pain, discomfort, or fever as directed by your caregiver.  If you had surgery and the wound was closed you may care for it as directed. This generally includes keeping it dry and clean and dressing it as directed. SEEK MEDICAL CARE IF:   You have increased pain, swelling, redness, drainage, or bleeding from the area.  You have a fever.  You have muscles aches, dizziness, or a general ill feeling. Document Released: 04/03/2006 Document Revised: 11/03/2012 Document Reviewed: 06/18/2006 [facility] Patient Information 2015 [place], [place]. This  information is not intended to replace advice given to you by your health care provider. Make sure you discuss any questions you have with your health care provider.    Emergency Department Resource Guide 1) Find a Doctor and Pay Out of Pocket Although you won't have to find out who is covered by your insurance plan, it is a good idea to ask around and get recommendations. You will then need to call the office and see if the doctor you have chosen will accept you as a new patient and what types of options they offer for patients who are self-pay. Some doctors offer discounts or will set up payment plans for their patients who do not have insurance, but you will need to ask so you aren't surprised when you get to your appointment.  2) Contact Your Local Health Department Not all health departments have doctors that can see patients for sick visits, but many do, so it is worth a call to see if yours does. If you don't know where your local health department is, you can check in your phone book. The CDC also  has a tool to help you locate your state's health department, and many state websites also have listings of all of their local health departments.  3) Find a Walk-in Clinic If your illness is not likely to be very severe or complicated, you may want to try a walk in clinic. These are popping up all over the country in pharmacies, drugstores, and shopping centers. They're usually staffed by nurse practitioners or physician assistants that have been trained to treat common illnesses and complaints. They're usually fairly quick and inexpensive. However, if you have serious medical issues or chronic medical problems, these are probably not your best option.  No Primary Care Doctor: - Call Health Connect at  [phone] - they can help you locate a primary care doctor that  accepts your insurance, provides certain services, etc. - Physician Referral Service- [phone]  Chronic Pain  Problems: Organization         Address  Phone   Notes  [facility]  [phone] Patients need to be referred by their primary care doctor.   Medication Assistance: Organization         Address  Phone   Notes  [facility] Medication [facility] [address] [place] [place], [place] [phone] --Must be a resident of [facility] -- Must have NO insurance coverage whatsoever (no Medicaid/ Medicare, etc.) -- The pt. MUST have a primary care doctor that directs their care regularly and follows them in the community   [facility]ist  [phone]   [employer]  [phone]    Agencies that provide inexpensive medical care: Organization         Address  Phone   Notes  [facility]  [phone]   [facility]    [phone]   [facility] [address] [place], [place] [address] [phone]   [facility] [address] [place] [place] [phone]   Planned Parenthood    [phone]   [facility]    [phone]   Community Health and [facility]  [address], [place] Phone:  [phone], Fax:  [phone] Hours of Operation:  9 am - 6 pm, M-F.  Also accepts Medicaid/Medicare and self-pay.  [facility]  [address], [place] Phone: [phone], Fax: [phone]. Hours of Operation:  8:30 am - 5:30 pm, M-F.  Also accepts Medicaid and self-pay.  [facility] [place] [address] [place] Phone: [phone]   [facility] [address] Natasha Bence [place], [place] [phone], Ext. [phone] Mondays & Thursdays: 7-9 AM.  First 15 patients are seen on a first come, first serve basis.    Medicaid-accepting [facility] Providers:  Organization         Address  Phone   Notes  [facility] [address], [place] [phone] Also  accepts self-pay patients.  [facility] [address] Laurell Josephs [place], [place]  [phone]   [facility] [address], [place] [phone]   [facility] [address] [place] [phone]   Renaye Rakers [address], [place]   [phone] Only accepts [place] Access [place] patients after they have their name applied to their card.   Self-Pay (no insurance) in  [facility]:  Organization         Address  Phone   Notes  Sickle Cell Patients, [facility] Internal Medicine [address] [place], [place] [phone]   [facility] [address] [place], [place] [phone]   [facility] [place]  [address], [place] [phone]   [facility]/Dr. Osei-Bonsu  [address], [place] or [address], [place] [phone] Phone number for both [place] and [place] locations is the same.  Urgent Medical and [facility] [address], [place] [phone]   [facility] [address] [place] or [address] [phone]   [facility] [address], [place] [phone], phone; [phone], fax Sees patients 1st and 3rd Saturday of every month.  Must not qualify for public or private insurance (i.e. Medicaid, Medicare, [place] Health Choice, Veterans' Benefits)  Household income should be no more than 200% of the poverty level The clinic cannot treat you if you are pregnant or think you are pregnant  Sexually transmitted diseases are not treated at the clinic.    Dental Care: Organization         Address  Phone  Notes  [facility] Department of [facility] [address] [place], [place] [phone] Accepts children up to age 29 who are enrolled in [place] or [place] Health Choice; pregnant  women with a Medicaid card; and children who have applied for Medicaid or [place] Health Choice, but were declined, whose parents can pay a reduced fee at time of service.  [facility] Department of [facility]  [address], [place] [phone] Accepts children up to age 38 who are enrolled in [place] or [place] Health Choice; pregnant women with a Medicaid card; and children who have applied for Medicaid or [place] Health Choice, but were declined, whose parents can pay a reduced fee at time of service.  [facility]  [address] [place], [place] [phone] Patients are seen by appointment only. Walk-ins are not accepted. [facility] will see patients 74 years of age and older. Monday - Tuesday (8am-5pm) Most Wednesdays (8:30-5pm) $30 per visit, cash only  [facility] Adult [facility] Access [facility]  [address], [facility] [phone] Patients are seen by appointment only. Walk-ins are not accepted. [facility] will see patients 9 years of age and older. One Wednesday Evening (Monthly: Volunteer Based).  $30 per visit, cash only  [employer]  [phone] for adults; Children under age 15, call Graduate Pediatric Dentistry at [phone]. Children aged 36-14, please call [phone] to request a pediatric application.  Dental services are provided in all areas of dental care including fillings, crowns and bridges, complete and partial dentures, implants, gum treatment, root canals, and extractions. Preventive care is also provided. Treatment is provided to both adults and children. Patients are selected via a lottery and there is often a waiting list.   [facility] [address], [place]  [phone] [email or web address]   [facility] [address] [place], [place] [phone], Ext. [phone] Second and Fourth Thursday of each month, opens at 6:30 AM; Clinic ends at 9 AM.  Patients are  seen on a first-come first-served basis, and a limited number are seen during each  clinic.   [facility]  [address] Ether Griffins [place], [place] [phone]   Eligibility Requirements You must have lived in [place], [place], or [place] counties for at least the last three months.   You cannot be eligible for state or federal sponsored [place], including [employer], [place], or [employer].   You generally cannot be eligible for healthcare insurance through your employer.    How to apply: Eligibility screenings are held every Tuesday and Wednesday afternoon from 1:00 pm until 4:00 pm. You do not need an appointment for the interview!  [facility] [address], [place], [place] [phone]   [facility] Health Department  [phone]   [facility] Health Department  [phone]   [facility] Health Department  [phone]    Behavioral Health Resources in the Community: Intensive Outpatient Programs Organization         Address  Phone  Notes  [facility] Services [address] [place], [place] [phone]   [facility] Outpatient [address], [place], [place] [phone]   [facility]: [facility] [address], [place], [place]  [phone]   [facility] Mental Health [address],  [place], [place] [phone] or [phone]   Substance Abuse Resources Organization         Address  Phone  Notes  [facility] Drug Services  [phone]   [facility]  [phone]   [facility]  [phone]   Floydene Flock  [phone]   Residential & Outpatient Substance Abuse Program  [phone]   Psychological Services Organization         Address  Phone  Notes  [facility] Behavioral Health  [phone]   [facility] Services  [phone]   [facility] Mental Health [address] [place] [phone] or [phone]    Mobile Crisis  Teams Organization         Address  Phone  Notes  [facility], Mobile Crisis Care Unit  [phone]   [facility]  [address] [place], [place] [phone]   Doristine Locks [address] [place] [phone]    Self-Help/Support Groups Organization         Address  Phone             Notes  [facility] - variety of support groups  336- I7437963 Call for more information  Narcotics Anonymous (NA), [facility] [address], [employer] [place]  2 meetings at this location   Statistician         Address  Phone  Notes  [facility] [address],    [place]  [phone]   [facility]  [address] [place] [address], [place], [place] [phone]   [facility] [address] [place], [place] [phone] Admissions: 8am-3pm M-F  [facility] [address]    [place], [place] [phone]   [facility] [address] Starling Manns [place], [place] [phone]   [facility] [address]  [place], [place] [phone]   [facility] - Intensive Outpatient [address] Laurell Josephs [address], [place], [place] [phone]   [facility] (Addiction Recovery Care [facility]oc.) [address] [place].,  [place], [place] [phone] or [phone]   [facility] (RT[facility]) [address] [place], [place] [phone] Accepts Medicaid  [facility] [address]  [place] [phone]  Substance Abuse/Addiction Treatment   [facility] [place] Behavioral Health Resources Organization         Address  Phone  Notes  [facility]  [phone]) [phone]   Angie FavaJulie Brannon, PhD [address] [place], [place]   [phone]) [phone] or [phone]) [phone]   [facility]   [address] [place], [place] [phone]) [phone]   [facility] [address], [place], [place] [phone]) [phone]  Insurance/Medicaid/sponsorship through [facility]  [facility] and Families [address]                                    [place], [place] [phone]) [phone] Therapy/tele-psych/case  [facility] [address]   [place], [place] [phone]) [phone]    Dr. Lolly MustacheArfeen  [phone]   [facility] [place]  [employer] [facility] [place] [facility] 1) [address], [place] 2) [address], [place] 3)  [address], [place] [phone]  [phone](336) [phone]   [facility] Child Abuse Hotline [phone](336) [phone] or [phone]) [phone] (After Hours)

## 2014-04-17 ENCOUNTER — Emergency Department (HOSPITAL_COMMUNITY): Payer: Self-pay

## 2014-04-17 ENCOUNTER — Emergency Department (HOSPITAL_COMMUNITY)
Admission: EM | Admit: 2014-04-17 | Discharge: 2014-04-17 | Disposition: A | Discharge status: Home / Self Care | Payer: Self-pay | Attending: Emergency Medicine | Admitting: Emergency Medicine

## 2014-04-17 ENCOUNTER — Encounter (HOSPITAL_COMMUNITY): Payer: Self-pay | Admitting: *Deleted

## 2014-04-17 DIAGNOSIS — Y9389 Activity, other specified: Secondary | ICD-10-CM | POA: Insufficient documentation

## 2014-04-17 DIAGNOSIS — S199XXA Unspecified injury of neck, initial encounter: Secondary | ICD-10-CM | POA: Insufficient documentation

## 2014-04-17 DIAGNOSIS — Y998 Other external cause status: Secondary | ICD-10-CM | POA: Insufficient documentation

## 2014-04-17 DIAGNOSIS — T148XXA Other injury of unspecified body region, initial encounter: Secondary | ICD-10-CM

## 2014-04-17 DIAGNOSIS — S0001XA Abrasion of scalp, initial encounter: Secondary | ICD-10-CM | POA: Insufficient documentation

## 2014-04-17 DIAGNOSIS — T24112A Burn of first degree of left thigh, initial encounter: Secondary | ICD-10-CM | POA: Insufficient documentation

## 2014-04-17 DIAGNOSIS — J45909 Unspecified asthma, uncomplicated: Secondary | ICD-10-CM | POA: Insufficient documentation

## 2014-04-17 DIAGNOSIS — R5383 Other fatigue: Secondary | ICD-10-CM | POA: Insufficient documentation

## 2014-04-17 DIAGNOSIS — Z720 Tobacco use: Secondary | ICD-10-CM | POA: Insufficient documentation

## 2014-04-17 DIAGNOSIS — Z23 Encounter for immunization: Secondary | ICD-10-CM | POA: Insufficient documentation

## 2014-04-17 DIAGNOSIS — Y92410 Unspecified street and highway as the place of occurrence of the external cause: Secondary | ICD-10-CM | POA: Insufficient documentation

## 2014-04-17 DIAGNOSIS — S060X1A Concussion with loss of consciousness of 30 minutes or less, initial encounter: Secondary | ICD-10-CM | POA: Insufficient documentation

## 2014-04-17 MED ORDER — TETANUS-DIPHTH-ACELL PERTUSSIS 5-2.5-18.5 LF-MCG/0.5 IM SUSP
0.5000 mL | Freq: Once | INTRAMUSCULAR | Status: AC
  Administered 2014-04-17: 0.5 mL via INTRAMUSCULAR
  Filled 2014-04-17: qty 0.5

## 2014-04-17 MED ORDER — BACITRACIN 500 UNIT/GM EX OINT: 1.0000 "application " | TOPICAL_OINTMENT | Freq: Two times a day (BID) | CUTANEOUS | Status: DC

## 2014-04-17 NOTE — ED Notes (Signed)
Declined W/C at D/C and was escorted to lobby by RN. 

## 2014-04-17 NOTE — Discharge Instructions (Signed)
Please call your doctor for a followup appointment within 24-48 hours. When you talk to your doctor please let them know that you were seen in the emergency department and have them acquire all of your records so that they can discuss the findings with you and formulate a treatment plan to fully care for your new and ongoing problems. Please follow-up with your primary care provider Please follow-up with neurology regarding concussion Site clean with warm water and soap, please apply antibiotic ointment Please avoid any other head injury 4 if there is another head injury within approximately 60-90 days of initial head injury this can lead to irreversible brain damage Please continue to monitor symptoms closely and if symptoms are to worsen or change (fever greater than 101, chills, sweating, nausea, vomiting, chest pain, shortness of breathe, difficulty breathing, weakness, numbness, tingling, worsening or changes to pain pattern, headache, dizziness, visual changes, neck pain, neck stiffness, inability keep food or fluids down, loss of sensation) please report back to the Emergency Department immediately.    Concussion Direct trauma to the head often causes a condition known as a concussion. This injury can temporarily interfere with brain function and may cause you to pass out (lose consciousness). The consequences of a concussion are usually short-term, but repetitive concussions can be very dangerous. If you have multiple concussions, you will have a greater risk of long-term effects, such as slurred speech, slow movements, impaired thinking, or tremors. The severity of a concussion is based on the length and severity of the interference with brain activity. SYMPTOMS  Symptoms of a concussion vary depending on the severity of the injury. Very mild concussions may even occur without any noticeable symptoms. Swelling in the area of the injury is not related to the seriousness of the injury.   Mild  concussion:  Temporary loss of consciousness may or may not occur.  Memory loss (amnesia) for a short time.  Emotional instability.  Confusion.  Severe concussion:  Usually prolonged loss of consciousness.  Confusion  One pupil (the black part in the middle of the eye) is larger than the other.  Changes in vision (including blurring).  Changes in breathing.  Disturbed balance (equilibrium).  Headaches.  Confusion.  Nausea or vomiting.  Slower reaction time than normal.  Difficulty learning and remembering things you have heard. CAUSES  A concussion is the result of trauma to the head. When the head is subjected to such an injury, the brain strikes against the inner wall of the skull. This impact is what causes the damage to the brain. The force of injury is related to severity of injury. The most severe concussions are associated with incidents that involve large impact forces such as motor vehicle accidents. Wearing a helmet will reduce the severity of trauma to the head, but concussions may still occur if you are wearing a helmet. RISK INCREASES WITH:  Contact sports (football, hockey, soccer, rugby, basketball or lacrosse).  Fighting sports (martial arts or boxing).  Riding bicycles, motorcycles, or horses (when you ride without a helmet). PREVENTION  Wear proper protective headgear and ensure correct fit.  Wear seat belts when driving and riding in a car.  Do not drink or use mind-altering drugs and drive. PROGNOSIS  Concussions are typically curable if they are recognized and treated early. If a severe concussion or multiple concussions go untreated, then the complications may be life-threatening or cause permanent disability and brain damage. RELATED COMPLICATIONS   Permanent brain damage (slurred speech, slow movement,  impaired thinking, or tremors).  Bleeding under the skull (subdural hemorrhage or hematoma, epidural hematoma).  Bleeding into the  brain.  Prolonged healing time if usual activities are resumed too soon.  Infection if skin over the concussion site is broken.  Increased risk of future concussions (less trauma is required for a second concussion than the first). TREATMENT  Treatment initially requires immediate evaluation to determine the severity of the concussion. Occasionally, a hospital stay may be required for observation and treatment.  Avoid exertion. Bed rest for the first 24-48 hours is recommended.  Return to play is a controversial subject due to the increased risk for future injury as well as permanent disability and should be discussed at length with your treating caregiver. Many factors such as the severity of the concussion and whether this is the first, second, or third concussion play a role in timing a patient's return to sports.  MEDICATION  Do not give any medicine, including non-prescription acetaminophen or aspirin, until the diagnosis is certain. These medicines may mask developing symptoms.  SEEK IMMEDIATE MEDICAL CARE IF:   Symptoms get worse or do not improve in 24 hours.  Any of the following symptoms occur:  Vomiting.  The inability to move arms and legs equally well on both sides.  Fever.  Neck stiffness.  Pupils of unequal size, shape, or reactivity.  Convulsions.  Noticeable restlessness.  Severe headache that persists for longer than 4 hours after injury.  Confusion, disorientation, or mental status changes. Document Released: 03/03/2005 Document Revised: 12/22/2012 Document Reviewed: 06/15/2008 [facility] Patient Information 2015 [place], [place]. This information is not intended to replace advice given to you by your health care provider. Make sure you discuss any questions you have with your health care provider.  Abrasions An abrasion is a cut or scrape of the skin. Abrasions do not go through all layers of the skin. HOME CARE  If a bandage (dressing) was put on your  wound, change it as told by your doctor. If the bandage sticks, soak it off with warm.  Wash the area with water and soap 2 times a day. Rinse off the soap. Pat the area dry with a clean towel.  Put on medicated cream (ointment) as told by your doctor.  Change your bandage right away if it gets wet or dirty.  Only take medicine as told by your doctor.  See your doctor within 24-48 hours to get your wound checked.  Check your wound for redness, puffiness (swelling), or yellowish-white fluid (pus). GET HELP RIGHT AWAY IF:   You have more pain in the wound.  You have redness, swelling, or tenderness around the wound.  You have pus coming from the wound.  You have a fever or lasting symptoms for more than 2-3 days.  You have a fever and your symptoms suddenly get worse.  You have a bad smell coming from the wound or bandage. MAKE SURE YOU:   Understand these instructions.  Will watch your condition.  Will get help right away if you are not doing well or get worse. Document Released: 08/20/2007 Document Revised: 11/26/2011 Document Reviewed: 02/04/2011 [facility] Patient Information 2015 [place], [place]. This information is not intended to replace advice given to you by your health care provider. Make sure you discuss any questions you have with your health care provider.   Emergency Department Resource Guide 1) Find a Doctor and Pay Out of Pocket Although you won't have to find out who is covered by your insurance  plan, it is a good idea to ask around and get recommendations. You will then need to call the office and see if the doctor you have chosen will accept you as a new patient and what types of options they offer for patients who are self-pay. Some doctors offer discounts or will set up payment plans for their patients who do not have insurance, but you will need to ask so you aren't surprised when you get to your appointment.  2) Contact Your Local Health Department Not all  health departments have doctors that can see patients for sick visits, but many do, so it is worth a call to see if yours does. If you don't know where your local health department is, you can check in your phone book. The CDC also has a tool to help you locate your state's health department, and many state websites also have listings of all of their local health departments.  3) Find a Walk-in Clinic If your illness is not likely to be very severe or complicated, you may want to try a walk in clinic. These are popping up all over the country in pharmacies, drugstores, and shopping centers. They're usually staffed by nurse practitioners or physician assistants that have been trained to treat common illnesses and complaints. They're usually fairly quick and inexpensive. However, if you have serious medical issues or chronic medical problems, these are probably not your best option.  No Primary Care Doctor: - Call Health Connect at  [phone] - they can help you locate a primary care doctor that  accepts your insurance, provides certain services, etc. - Physician Referral Service- [phone]  Chronic Pain Problems: Organization         Address  Phone   Notes  [facility]  [phone] Patients need to be referred by their primary care doctor.   Medication Assistance: Organization         Address  Phone   Notes  [facility] Medication [facility] [address] [place] [place], [place] [address] [phone] --Must be a resident of [facility] -- Must have NO insurance coverage whatsoever (no Medicaid/ Medicare, etc.) -- The pt. MUST have a primary care doctor that directs their care regularly and follows them in the community   [facility]  [phone]   [employer]  [phone]    Agencies that provide inexpensive medical care: Organization         Address  Phone   Notes  [facility]  [phone]   [facility]     [phone]   [facility] [address] [place], [place] [address] [phone]   [facility] [address] [place], [place] [phone]   Planned Parenthood    [phone]   [facility]    [phone]   Community Health and [facility]  [address], [place] Phone:  [phone], Fax:  [phone] Hours of Operation:  9 am - 6 pm, M-F.  Also accepts Medicaid/Medicare and self-pay.  [facility]  [address], [place] Phone: [phone], Fax: [phone]. Hours of Operation:  8:30 am - 5:30 pm, M-F.  Also accepts Medicaid and self-pay.  [facility] [place] [address] [place] Phone: [phone]   [facility] [address] Natasha Bence [place], [place] [phone], Ext. [phone] Mondays & Thursdays:  7-9 AM.  First 15 patients are seen on a first come, first serve basis.    Medicaid-accepting [facility] [place] Providers:  Organization         Address  Phone   Notes  [facility] [address], [place] [phone]) [phone] Also accepts self-pay patients.  [facility] [address], [place]  [phone](336) [phone]   [facility] [address], [place] [phone]) [phone]   [facility] [address] [place] [address], [place] [phone]) [phone]   Renaye RakersVeita Bland [address], [place]   [phone]) [phone] Only accepts [place] Access [place] patients after they have their name applied to their card.   Self-Pay (no insurance) in [facility] [place]:  Organization         Address  Phone   Notes  Sickle Cell Patients, [facility] [address], [place] [phone](336) [phone]   [facility] [address], [place] [phone]) [phone]   [facility]  [address], [place] [phone](336) [phone]     [facility]/Dr. Osei-Bonsu  [address] [place] [address], [place] or [phone], [place] [phone] Phone number for both [place] and [place] locations is the same.  [facility] [address], [place] [phone]) [phone]   [facility] [place] [address] [place] [address], [place] or [address] [phone](336) [phone]   [facility] [address], [place] [phone](336) [phone], phone; [phone](336) [phone], fax Sees patients 1st and 3rd Saturday of every month.  Must not qualify for public or private insurance (i.e. Medicaid, Medicare, [place] Health Choice, Veterans' Benefits)  Household income should be no more than 200% of the poverty level The clinic cannot treat you if you are pregnant or think you are pregnant  Sexually transmitted diseases are not treated at the clinic.    Dental Care: Organization         Address  Phone  Notes  [facility] [address], [place] [phone]) [phone] Accepts children up to age 10821 who are enrolled in [place] or [place] Health Choice; pregnant women with a Medicaid card; and children who have applied for Medicaid or [place] Health Choice, but were declined, whose parents can pay a reduced fee at time of service.  [facility] [place]  [address], [place] [phone]) [phone] Accepts children up to age 24 who are enrolled in [place] or [place] Health Choice; pregnant women with a Medicaid card; and children who have applied for Medicaid or  Health Choice, but were declined, whose parents can pay a reduced fee at time of service.  [facility]  [address], [place] [phone] Patients are seen by appointment only. Walk-ins are not accepted. [facility] will see patients 24 years of age and older. Monday - Tuesday (8am-5pm) Most Wednesdays (8:30-5pm) $30 per visit,  cash only  [facility]  [address], [facility] [phone] Patients are seen by appointment only. Walk-ins are not accepted. [facility] will see patients 24 years of age and older. One Wednesday Evening (Monthly: Volunteer Based).  $30 per visit, cash only  [employer] [facility] [facility]  [phone] for adults; Children under  age 86, call Graduate Pediatric Dentistry at [phone]. Children aged 12-14, please call [phone] to request a pediatric application.  Dental services are provided in all areas of dental care including fillings, crowns and bridges, complete and partial dentures, implants, gum treatment, root canals, and extractions. Preventive care is also provided. Treatment is provided to both adults and children. Patients are selected via a lottery and there is often a waiting list.   [facility] [address] [place]  [phone] [email or web address]   [facility] [address] [place], [place] [phone], Ext. [phone] Second and Fourth Thursday of each month, opens at 6:30 AM; Clinic ends at 9 AM.  Patients are seen on a first-come first-served basis, and a limited number are seen during each clinic.   [facility]  [address] [place], [place] [phone]   Eligibility Requirements You must have lived in [place], [place], or [place] counties for at least the last three months.   You cannot be eligible for state or federal sponsored [place], including [employer], [place], or [employer].   You generally cannot be eligible for healthcare insurance through your employer.    How to apply: Eligibility screenings are held every Tuesday and Wednesday afternoon from 1:00 pm until 4:00 pm. You do not need an appointment for the interview!  [facility] [address] [place], [place] [phone]   [facility] Health Department   [phone]   [facility] Health Department  [phone]   [facility] Health Department  [phone]    Behavioral Health Resources in the Community: Intensive Outpatient Programs Organization         Address  Phone  Notes  [facility] [address], [place], [place] [phone]   [facility] [address] [place], [place] [phone]   [facility] [address] [place], [place]  [phone]   [facility] Mental Health [address]  [place], [place] [phone] or [phone]   Substance Abuse Resources Organization         Address  Phone  Notes  [facility] Drug [facility]  [phone]   [facility]  [phone]   [facility]  [phone]   Floydene Flock  [phone]   Residential & Outpatient Substance Abuse Program  2023179561   Psychological Services Organization         Address  Phone  Notes  [facility]  [phone]   [facility]  [phone]   [facility] [address] [place] [phone] or [phone]    Mobile Crisis Teams Organization         Address  Phone  Notes  [facility], Mobile Crisis Care Unit  [phone]   [facility]  [address] [place], [place] [phone]   Doristine Locks [address] [place] [phone]    Self-Help/Support Groups Organization         Address  Phone             Notes  [facility] [place] - variety of support groups  [phone]- I7437963 Call for more information  Narcotics Anonymous (NA), [facility] [address], [employer]   2 meetings at this location   Chief Executive Officer  Notes  [facility] [phone] [address]  Lynne Logan [place]  [phone]   [facility]  [address], [place] [address], [place], [place] [phone]    [facility] [address] [place], [place] [phone] Admissions: 8am-3pm M-F  [facility] [address],    [place], [place] [phone]   [facility] [address] [place], [place], [place] [phone]   [facility] [address]  [place], [place] [phone]   [facility] - Intensive Outpatient [address], [place], [place] [phone]   [facility] [address] [place].,  [place], [place] [phone] or [phone]   [facility] ([facility]) [address] [place], [place] [phone] Accepts Medicaid  [facility] [address],  [place] [phone] Substance Abuse/Addiction Treatment   [facility] Organization         Address  Phone  Notes  [facility]  [phone]   Angie Fava, PhD [address] Ervin Knack [place], [place]   [phone] or [phone]   [facility]   [address] [place], [place] [phone]   [facility] [address], [place], [place] [phone] Insurance/Medicaid/sponsorship through [facility] [address]                                    [place], [place] [phone] Therapy/tele-psych/case  [facility] [address], [place] [phone]    Dr. Lolly Mustache  [phone]   [facility] [place]  [employer] [facility] Dept. 1) [address] [place] 2) [address], [place] 3)  [address], [place] [phone]  [phone]   [facility] Child Abuse Hotline 610-692-6455 or [phone] (After Hours)

## 2014-04-17 NOTE — ED Notes (Signed)
Patient transported to X-ray 
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
## 2014-05-05 ENCOUNTER — Emergency Department (HOSPITAL_COMMUNITY)
Admission: EM | Admit: 2014-05-05 | Discharge: 2014-05-06 | Discharge status: Against Medical Advice | Payer: Self-pay | Attending: Emergency Medicine | Admitting: Emergency Medicine

## 2014-05-05 DIAGNOSIS — R569 Unspecified convulsions: Secondary | ICD-10-CM

## 2014-05-05 DIAGNOSIS — G40909 Epilepsy, unspecified, not intractable, without status epilepticus: Secondary | ICD-10-CM | POA: Insufficient documentation

## 2014-05-05 MED ORDER — SODIUM CHLORIDE 0.9 % IV SOLN
INTRAVENOUS | Status: DC
  Administered 2014-05-06: 01:00:00 via INTRAVENOUS

## 2014-05-05 MED ORDER — LEVETIRACETAM IN NACL 500 MG/100ML IV SOLN
500.0000 mg | Freq: Once | INTRAVENOUS | Status: AC
  Administered 2014-05-06: 500 mg via INTRAVENOUS
  Filled 2014-05-05: qty 100
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
## 2014-05-05 NOTE — ED Notes (Signed)
Pt. On monitor. 

## 2014-05-06 ENCOUNTER — Encounter (HOSPITAL_COMMUNITY): Payer: Self-pay | Admitting: Emergency Medicine

## 2014-05-06 LAB — CBC WITH DIFFERENTIAL/PLATELET
BASOS ABS: 0 10*3/uL (ref 0.0–0.1)
BASOS PCT: 0 % (ref 0–1)
EOS PCT: 1 % (ref 0–5)
Eosinophils Absolute: 0.1 10*3/uL (ref 0.0–0.7)
HCT: 38.7 % — ABNORMAL LOW (ref 39.0–52.0)
Hemoglobin: 12.7 g/dL — ABNORMAL LOW (ref 13.0–17.0)
Lymphocytes Relative: 46 % (ref 12–46)
Lymphs Abs: 2.9 10*3/uL (ref 0.7–4.0)
MCH: 27.2 pg (ref 26.0–34.0)
MCHC: 32.8 g/dL (ref 30.0–36.0)
MCV: 82.9 fL (ref 78.0–100.0)
Monocytes Absolute: 0.7 10*3/uL (ref 0.1–1.0)
Monocytes Relative: 11 % (ref 3–12)
Neutro Abs: 2.7 10*3/uL (ref 1.7–7.7)
Neutrophils Relative %: 42 % — ABNORMAL LOW (ref 43–77)
Platelets: 244 10*3/uL (ref 150–400)
RBC: 4.67 MIL/uL (ref 4.22–5.81)
RDW: 14.7 % (ref 11.5–15.5)
WBC: 6.3 10*3/uL (ref 4.0–10.5)

## 2014-05-06 LAB — COMPREHENSIVE METABOLIC PANEL
ALBUMIN: 3.7 g/dL (ref 3.5–5.2)
ALK PHOS: 50 U/L (ref 39–117)
ALT: 18 U/L (ref 0–53)
AST: 26 U/L (ref 0–37)
Anion gap: 10 (ref 5–15)
BUN: 9 mg/dL (ref 6–23)
CHLORIDE: 108 mmol/L (ref 96–112)
CO2: 22 mmol/L (ref 19–32)
Calcium: 8.2 mg/dL — ABNORMAL LOW (ref 8.4–10.5)
Creatinine, Ser: 1.09 mg/dL (ref 0.50–1.35)
GFR calc Af Amer: >90 mL/min (ref >90)
GLUCOSE: 80 mg/dL (ref 70–99)
Potassium: 3.1 mmol/L — ABNORMAL LOW (ref 3.5–5.1)
Sodium: 140 mmol/L (ref 135–145)
Total Bilirubin: 0.3 mg/dL (ref 0.3–1.2)
Total Protein: 6.2 g/dL (ref 6.0–8.3)

## 2014-05-06 MED ORDER — POTASSIUM CHLORIDE CRYS ER 20 MEQ PO TBCR: 40.0000 meq | EXTENDED_RELEASE_TABLET | Freq: Once | ORAL | Status: DC

## 2014-05-06 NOTE — ED Notes (Signed)
Pt. Made aware for the need of urine. 
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
## 2014-05-08 ENCOUNTER — Encounter (HOSPITAL_COMMUNITY): Payer: Self-pay | Admitting: *Deleted

## 2014-07-13 ENCOUNTER — Encounter (HOSPITAL_COMMUNITY): Payer: Self-pay | Admitting: Emergency Medicine

## 2014-07-13 ENCOUNTER — Emergency Department (HOSPITAL_COMMUNITY)
Admission: EM | Admit: 2014-07-13 | Discharge: 2014-07-14 | Disposition: A | Discharge status: Home / Self Care | Payer: Self-pay | Attending: Emergency Medicine | Admitting: Emergency Medicine

## 2014-07-13 DIAGNOSIS — Z79899 Other long term (current) drug therapy: Secondary | ICD-10-CM | POA: Insufficient documentation

## 2014-07-13 DIAGNOSIS — Z720 Tobacco use: Secondary | ICD-10-CM | POA: Insufficient documentation

## 2014-07-13 DIAGNOSIS — Z8619 Personal history of other infectious and parasitic diseases: Secondary | ICD-10-CM | POA: Insufficient documentation

## 2014-07-13 DIAGNOSIS — R109 Unspecified abdominal pain: Secondary | ICD-10-CM | POA: Insufficient documentation

## 2014-07-13 DIAGNOSIS — Z202 Contact with and (suspected) exposure to infections with a predominantly sexual mode of transmission: Secondary | ICD-10-CM | POA: Insufficient documentation

## 2014-07-13 DIAGNOSIS — J45909 Unspecified asthma, uncomplicated: Secondary | ICD-10-CM | POA: Insufficient documentation

## 2014-07-13 LAB — COMPREHENSIVE METABOLIC PANEL
ALK PHOS: 73 U/L (ref 39–117)
ALT: 19 U/L (ref 0–53)
AST: 22 U/L (ref 0–37)
Albumin: 4 g/dL (ref 3.5–5.2)
Anion gap: 9 (ref 5–15)
BILIRUBIN TOTAL: 0.3 mg/dL (ref 0.3–1.2)
BUN: 7 mg/dL (ref 6–23)
CO2: 28 mmol/L (ref 19–32)
Calcium: 9.3 mg/dL (ref 8.4–10.5)
Chloride: 103 mmol/L (ref 96–112)
Creatinine, Ser: 1.38 mg/dL — ABNORMAL HIGH (ref 0.50–1.35)
GFR calc Af Amer: 82 mL/min — ABNORMAL LOW (ref >90)
GFR calc non Af Amer: 71 mL/min — ABNORMAL LOW (ref >90)
GLUCOSE: 107 mg/dL — AB (ref 70–99)
POTASSIUM: 4.3 mmol/L (ref 3.5–5.1)
SODIUM: 140 mmol/L (ref 135–145)
TOTAL PROTEIN: 6.8 g/dL (ref 6.0–8.3)

## 2014-07-13 LAB — CBC WITH DIFFERENTIAL/PLATELET
Basophils Absolute: 0 10*3/uL (ref 0.0–0.1)
Basophils Relative: 0 % (ref 0–1)
EOS PCT: 1 % (ref 0–5)
Eosinophils Absolute: 0 10*3/uL (ref 0.0–0.7)
HEMATOCRIT: 40.3 % (ref 39.0–52.0)
Hemoglobin: 13.2 g/dL (ref 13.0–17.0)
Lymphocytes Relative: 38 % (ref 12–46)
Lymphs Abs: 2.5 10*3/uL (ref 0.7–4.0)
MCH: 26.9 pg (ref 26.0–34.0)
MCHC: 32.8 g/dL (ref 30.0–36.0)
MCV: 82.2 fL (ref 78.0–100.0)
MONO ABS: 0.9 10*3/uL (ref 0.1–1.0)
Monocytes Relative: 14 % — ABNORMAL HIGH (ref 3–12)
Neutro Abs: 3 10*3/uL (ref 1.7–7.7)
Neutrophils Relative %: 47 % (ref 43–77)
PLATELETS: 232 10*3/uL (ref 150–400)
RBC: 4.9 MIL/uL (ref 4.22–5.81)
RDW: 14.5 % (ref 11.5–15.5)
WBC: 6.5 10*3/uL (ref 4.0–10.5)

## 2014-07-13 LAB — URINALYSIS, ROUTINE W REFLEX MICROSCOPIC
BILIRUBIN URINE: NEGATIVE
Glucose, UA: NEGATIVE mg/dL
HGB URINE DIPSTICK: NEGATIVE
Ketones, ur: 15 mg/dL — AB
Leukocytes, UA: NEGATIVE
Nitrite: NEGATIVE
Protein, ur: NEGATIVE mg/dL
SPECIFIC GRAVITY, URINE: 1.028 (ref 1.005–1.030)
Urobilinogen, UA: 1 mg/dL (ref 0.0–1.0)
pH: 6.5 (ref 5.0–8.0)

## 2014-07-13 LAB — LIPASE, BLOOD: Lipase: 21 U/L (ref 11–59)

## 2014-07-13 NOTE — ED Notes (Signed)
Pt. reports low abdominal pain onset this week , denies emesis or diarrhea. Pt. also requesting STD screening endorses sexual partner has STD , denies penile discharge or dysuria .

## 2014-07-14 LAB — HIV ANTIBODY (ROUTINE TESTING W REFLEX): HIV Screen 4th Generation wRfx: NONREACTIVE

## 2014-07-14 LAB — GC/CHLAMYDIA PROBE AMP (~~LOC~~) NOT AT ARMC
Chlamydia: NEGATIVE
Neisseria Gonorrhea: NEGATIVE

## 2014-07-14 LAB — RPR: RPR Ser Ql: NONREACTIVE

## 2014-07-14 MED ORDER — CEFTRIAXONE SODIUM 250 MG IJ SOLR
250.0000 mg | Freq: Once | INTRAMUSCULAR | Status: AC
  Administered 2014-07-14: 250 mg via INTRAMUSCULAR
  Filled 2014-07-14: qty 250

## 2014-07-14 MED ORDER — AZITHROMYCIN 250 MG PO TABS
1000.0000 mg | ORAL_TABLET | Freq: Once | ORAL | Status: AC
  Administered 2014-07-14: 1000 mg via ORAL
  Filled 2014-07-14: qty 4

## 2014-07-14 NOTE — Discharge Instructions (Signed)
Safe Sex Safe sex is about reducing the risk of giving or getting a sexually transmitted disease (STD). STDs are spread through sexual contact involving the genitals, mouth, or rectum. Some STDs can be cured and others cannot. Safe sex can also prevent unintended pregnancies.  WHAT ARE SOME SAFE SEX PRACTICES?  Limit your sexual activity to only one partner who is having sex with only you.  Talk to your partner about his or her past partners, past STDs, and drug use.  Use a condom every time you have sexual intercourse. This includes vaginal, oral, and anal sexual activity. Both females and males should wear condoms during oral sex. Only use latex or polyurethane condoms and water-based lubricants. Using petroleum-based lubricants or oils to lubricate a condom will weaken the condom and increase the chance that it will break. The condom should be in place from the beginning to the end of sexual activity. Wearing a condom reduces, but does not completely eliminate, your risk of getting or giving an STD. STDs can be spread by contact with infected body fluids and skin.  Get vaccinated for hepatitis B and HPV.  Avoid alcohol and recreational drugs, which can affect your judgment. You may forget to use a condom or participate in high-risk sex.  For females, avoid douching after sexual intercourse. Douching can spread an infection farther into the reproductive tract.  Check your body for signs of sores, blisters, rashes, or unusual discharge. See your health care provider if you notice any of these signs.  Avoid sexual contact if you have symptoms of an infection or are being treated for an STD. If you or your partner has herpes, avoid sexual contact when blisters are present. Use condoms at all other times.  If you are at risk of being infected with HIV, it is recommended that you take a prescription medicine daily to prevent HIV infection. This is called pre-exposure prophylaxis (PrEP). You are  considered at risk if:  You are a man who has sex with other men (MSM).  You are a heterosexual man or woman who is sexually active with more than one partner.  You take drugs by injection.  You are sexually active with a partner who has HIV.  Talk with your health care provider about whether you are at high risk of being infected with HIV. If you choose to begin PrEP, you should first be tested for HIV. You should then be tested every 3 months for as long as you are taking PrEP.  See your health care provider for regular screenings, exams, and tests for other STDs. Before having sex with a new partner, each of you should be screened for STDs and should talk about the results with each other. WHAT ARE THE BENEFITS OF SAFE SEX?   There is less chance of getting or giving an STD.  You can prevent unwanted or unintended pregnancies.  By discussing safe sex concerns with your partner, you may increase feelings of intimacy, comfort, trust, and honesty between the two of you. Document Released: 04/10/2004 Document Revised: 07/18/2013 Document Reviewed: 08/25/2011 [facility] Patient Information 2015 [place]. This information is not intended to replace advice given to you by your health care provider. Make sure you discuss any questions you have with your health care provider.  Please practice safe sex, monitor for new symptoms [facility] if any present.

## 2014-07-14 NOTE — ED Notes (Signed)
Discharge instructions given voiced understanding

## 2014-07-14 NOTE — ED Notes (Signed)
Patient unwilling to wear BP cuff.

## 2014-07-14 NOTE — ED Provider Notes (Signed)
CSN: [id number]     Arrival date & time 07/13/14  2213 History   First MD Initiated Contact with Patient 07/14/14 0010     Chief Complaint  Patient presents with  . Abdominal Pain  . Exposure to STD   HPI   24 year old male presents today to be "checked out". Patient reports that his significant other was recently diagnosed with chlamydia and would like to have his "entire health checked" with any tests that we could do. When asked specifically what his concerns were for today he did not have any, reports that he does not have a primary care or insurance and would like head to toe evaluation to make sure he is okay. When completing review of systems patient denies any complaints with the addition of occasional constipation, and "bumps" on his penis. Patient would like to be treated prophylactically for chlamydia, he denies any urinary complaints including frequency, pain, discharge, fever, chills, abdominal pain.   Past Medical History  Diagnosis Date  . Asthma    Past Surgical History  Procedure Laterality Date  . Mandible fracture surgery     Family History  Problem Relation Age of Onset  . Seizures Sister    History  Substance Use Topics  . Smoking status: Current Some Day Smoker  . Smokeless tobacco: Never Used  . Alcohol Use: Yes    Review of Systems  All other systems reviewed and are negative.   Allergies  Review of patient's allergies indicates no known allergies.  Home Medications   Prior to Admission medications   Medication Sig Start Date End Date Taking? Authorizing Provider  albuterol (PROVENTIL HFA;VENTOLIN HFA) 108 (90 BASE) MCG/ACT inhaler Inhale 2 puffs into the lungs every 6 (six) hours as needed for wheezing or shortness of breath.     Historical Provider, MD  HYDROcodone-acetaminophen (NORCO/VICODIN) 5-325 MG per tablet Take 1 tablet by mouth every 4 (four) hours as needed. 02/05/14   Trixie DredgeEmily West, PA-C  ibuprofen (ADVIL,MOTRIN) 800 MG tablet Take 1  tablet (800 mg total) by mouth every 8 (eight) hours as needed for mild pain or moderate pain. 02/05/14   Trixie DredgeEmily West, PA-C  traMADol (ULTRAM) 50 MG tablet Take 50 mg by mouth every 6 (six) hours as needed for moderate pain.    Historical Provider, MD   BP 119/71 mmHg  Pulse 95  Temp(Src) 99.2 F (37.3 C) (Oral)  Resp 16  Ht 6' (1.829 m)  Wt 214 lb (97.07 kg)  BMI 29.02 kg/m2  SpO2 98% Physical Exam  Constitutional: He is oriented to person, place, and time. He appears well-developed and well-nourished.  HENT:  Head: Normocephalic and atraumatic.  Eyes: Pupils are equal, round, and reactive to light.  Neck: Normal range of motion. Neck supple. No JVD present. No tracheal deviation present. No thyromegaly present.  Cardiovascular: Regular rhythm, normal heart sounds and intact distal pulses.  Exam reveals no gallop and no friction rub.   No murmur heard. Pulmonary/Chest: Effort normal and breath sounds normal. No stridor. No respiratory distress. He has no wheezes. He has no rales. He exhibits no tenderness.  Genitourinary: Penis normal. No penile tenderness.  Pearly penile papules  Musculoskeletal: Normal range of motion.  Lymphadenopathy:    He has no cervical adenopathy.  Neurological: He is alert and oriented to person, place, and time. Coordination normal.  Skin: Skin is warm and dry.  Psychiatric: He has a normal mood and affect. His behavior is normal. Judgment and thought content normal.  Nursing note and vitals reviewed.   ED Course  Procedures (including critical care time) Labs Review Labs Reviewed  CBC WITH DIFFERENTIAL/PLATELET - Abnormal; Notable for the following:    Monocytes Relative 14 (*)    All other components within normal limits  COMPREHENSIVE METABOLIC PANEL - Abnormal; Notable for the following:    Glucose, Bld 107 (*)    Creatinine, Ser 1.38 (*)    GFR calc non Af Amer 71 (*)    GFR calc Af Amer 82 (*)    All other components within normal limits   URINALYSIS, ROUTINE W REFLEX MICROSCOPIC - Abnormal; Notable for the following:    Ketones, ur 15 (*)    All other components within normal limits  LIPASE, BLOOD    Imaging Review No results found.   EKG Interpretation None      MDM   Final diagnoses:  STD exposure    Labs: Urinalysis, CBC, CMP, lipase no significant findings with the addition of elevated creatinine  Imaging: None indicated  Consults: None indicated  Therapeutics: Rocephin, azithromycin  Assessment: STD exposure  Plan: Patient presents after exposure to someone with known STDs, requesting testing and treatment. Patient denies any complaints in addition to the exposure, he was requesting a head to toe evaluation with "all the tests we had" with no specific reason in mind. It was only with questioning there was able to reveal the STD exposure and his intermittent constipation. Patient was thoroughly evaluated with the only finding of Pearly penile papules on his penis. She was encouraged to practice safe sex, monitor for STD related symptoms, follow-up with the health department for any present. Pt understood and agreed to the plan and had no questions or concerns at time of discharge.      Eyvonne Mechanic, PA-C 07/14/14 0102  Lorre Nick, MD 07/19/14 [phone]

## 2014-09-30 ENCOUNTER — Encounter (HOSPITAL_COMMUNITY): Payer: Self-pay | Admitting: *Deleted

## 2014-09-30 ENCOUNTER — Emergency Department (INDEPENDENT_AMBULATORY_CARE_PROVIDER_SITE_OTHER)
Admission: EM | Admit: 2014-09-30 | Discharge: 2014-09-30 | Disposition: A | Discharge status: Home / Self Care | Payer: Self-pay | Source: Home / Self Care | Attending: Family Medicine | Admitting: Family Medicine

## 2014-09-30 ENCOUNTER — Emergency Department (INDEPENDENT_AMBULATORY_CARE_PROVIDER_SITE_OTHER): Payer: Self-pay

## 2014-09-30 DIAGNOSIS — S20211A Contusion of right front wall of thorax, initial encounter: Secondary | ICD-10-CM

## 2014-09-30 MED ORDER — IBUPROFEN 800 MG PO TABS
800.0000 mg | ORAL_TABLET | Freq: Three times a day (TID) | ORAL | Status: DC
Start: 2014-09-30 — End: 2015-02-12
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
## 2014-09-30 NOTE — Discharge Instructions (Signed)
Heat and medicine as needed, activity as tolerated.
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
## 2015-01-29 ENCOUNTER — Emergency Department (INDEPENDENT_AMBULATORY_CARE_PROVIDER_SITE_OTHER): Payer: Self-pay

## 2015-01-29 ENCOUNTER — Encounter (HOSPITAL_COMMUNITY): Payer: Self-pay | Admitting: *Deleted

## 2015-01-29 ENCOUNTER — Emergency Department (INDEPENDENT_AMBULATORY_CARE_PROVIDER_SITE_OTHER)
Admission: EM | Admit: 2015-01-29 | Discharge: 2015-01-29 | Disposition: A | Discharge status: Home / Self Care | Payer: Self-pay | Source: Home / Self Care | Attending: Family Medicine | Admitting: Family Medicine

## 2015-01-29 DIAGNOSIS — S79811A Other specified injuries of right hip, initial encounter: Secondary | ICD-10-CM

## 2015-01-29 MED ORDER — TRAMADOL HCL 50 MG PO TABS: 50.0000 mg | ORAL_TABLET | Freq: Four times a day (QID) | ORAL | Status: DC | PRN

## 2015-01-29 MED ORDER — KETOROLAC TROMETHAMINE 30 MG/ML IJ SOLN
30.0000 mg | Freq: Once | INTRAMUSCULAR | Status: AC
  Administered 2015-01-29: 30 mg via INTRAMUSCULAR

## 2015-01-29 MED ORDER — KETOROLAC TROMETHAMINE 30 MG/ML IJ SOLN
INTRAMUSCULAR | Status: AC
  Filled 2015-01-29: qty 1
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
## 2015-01-29 NOTE — Discharge Instructions (Signed)
Ice tonight to bruised hip, pain medicine as needed, activity as tolerated.

## 2015-02-12 ENCOUNTER — Encounter (HOSPITAL_COMMUNITY): Payer: Self-pay | Admitting: Emergency Medicine

## 2015-02-12 ENCOUNTER — Emergency Department (HOSPITAL_COMMUNITY)
Admission: EM | Admit: 2015-02-12 | Discharge: 2015-02-13 | Disposition: A | Discharge status: Home / Self Care | Payer: No Typology Code available for payment source | Attending: Emergency Medicine | Admitting: Emergency Medicine

## 2015-02-12 DIAGNOSIS — Y92410 Unspecified street and highway as the place of occurrence of the external cause: Secondary | ICD-10-CM | POA: Diagnosis not present

## 2015-02-12 DIAGNOSIS — Y998 Other external cause status: Secondary | ICD-10-CM | POA: Diagnosis not present

## 2015-02-12 DIAGNOSIS — Y9389 Activity, other specified: Secondary | ICD-10-CM | POA: Diagnosis not present

## 2015-02-12 DIAGNOSIS — F17200 Nicotine dependence, unspecified, uncomplicated: Secondary | ICD-10-CM | POA: Diagnosis not present

## 2015-02-12 DIAGNOSIS — J029 Acute pharyngitis, unspecified: Secondary | ICD-10-CM | POA: Diagnosis not present

## 2015-02-12 DIAGNOSIS — J45909 Unspecified asthma, uncomplicated: Secondary | ICD-10-CM | POA: Insufficient documentation

## 2015-02-12 DIAGNOSIS — S3992XA Unspecified injury of lower back, initial encounter: Secondary | ICD-10-CM | POA: Diagnosis present

## 2015-02-12 DIAGNOSIS — S39012A Strain of muscle, fascia and tendon of lower back, initial encounter: Secondary | ICD-10-CM | POA: Diagnosis not present

## 2015-02-12 DIAGNOSIS — R112 Nausea with vomiting, unspecified: Secondary | ICD-10-CM | POA: Insufficient documentation

## 2015-02-12 DIAGNOSIS — S79911A Unspecified injury of right hip, initial encounter: Secondary | ICD-10-CM | POA: Insufficient documentation

## 2015-02-12 DIAGNOSIS — Z79899 Other long term (current) drug therapy: Secondary | ICD-10-CM | POA: Insufficient documentation

## 2015-02-12 MED ORDER — IBUPROFEN 800 MG PO TABS: 800.0000 mg | ORAL_TABLET | Freq: Three times a day (TID) | ORAL | Status: DC

## 2015-02-12 MED ORDER — MAGIC MOUTHWASH W/LIDOCAINE: 5.0000 mL | Freq: Four times a day (QID) | ORAL | Status: DC | PRN

## 2015-02-12 MED ORDER — KETOROLAC TROMETHAMINE 60 MG/2ML IM SOLN
30.0000 mg | Freq: Once | INTRAMUSCULAR | Status: AC
  Administered 2015-02-13: 30 mg via INTRAMUSCULAR
  Filled 2015-02-12: qty 2

## 2015-02-12 NOTE — Discharge Instructions (Signed)
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
## 2015-02-17 ENCOUNTER — Emergency Department (HOSPITAL_COMMUNITY): Payer: Self-pay

## 2015-02-17 ENCOUNTER — Emergency Department (HOSPITAL_COMMUNITY)
Admission: EM | Admit: 2015-02-17 | Discharge: 2015-02-17 | Disposition: A | Discharge status: Home / Self Care | Payer: Self-pay | Attending: Emergency Medicine | Admitting: Emergency Medicine

## 2015-02-17 ENCOUNTER — Encounter (HOSPITAL_COMMUNITY): Payer: Self-pay | Admitting: Emergency Medicine

## 2015-02-17 DIAGNOSIS — Y999 Unspecified external cause status: Secondary | ICD-10-CM | POA: Insufficient documentation

## 2015-02-17 DIAGNOSIS — S0083XA Contusion of other part of head, initial encounter: Secondary | ICD-10-CM | POA: Insufficient documentation

## 2015-02-17 DIAGNOSIS — J45909 Unspecified asthma, uncomplicated: Secondary | ICD-10-CM | POA: Insufficient documentation

## 2015-02-17 DIAGNOSIS — F17210 Nicotine dependence, cigarettes, uncomplicated: Secondary | ICD-10-CM | POA: Insufficient documentation

## 2015-02-17 DIAGNOSIS — S60511A Abrasion of right hand, initial encounter: Secondary | ICD-10-CM | POA: Insufficient documentation

## 2015-02-17 DIAGNOSIS — S00511A Abrasion of lip, initial encounter: Secondary | ICD-10-CM | POA: Insufficient documentation

## 2015-02-17 DIAGNOSIS — S0033XA Contusion of nose, initial encounter: Secondary | ICD-10-CM | POA: Insufficient documentation

## 2015-02-17 DIAGNOSIS — S0242XA Fracture of alveolus of maxilla, initial encounter for closed fracture: Secondary | ICD-10-CM | POA: Insufficient documentation

## 2015-02-17 DIAGNOSIS — Y9389 Activity, other specified: Secondary | ICD-10-CM | POA: Insufficient documentation

## 2015-02-17 DIAGNOSIS — Y9289 Other specified places as the place of occurrence of the external cause: Secondary | ICD-10-CM | POA: Insufficient documentation

## 2015-02-17 DIAGNOSIS — S60512A Abrasion of left hand, initial encounter: Secondary | ICD-10-CM | POA: Insufficient documentation

## 2015-02-17 DIAGNOSIS — S0993XA Unspecified injury of face, initial encounter: Secondary | ICD-10-CM | POA: Insufficient documentation

## 2015-02-17 MED ORDER — HYDROCODONE-ACETAMINOPHEN 5-325 MG PO TABS: 2.0000 | ORAL_TABLET | ORAL | Status: DC | PRN

## 2015-02-17 MED ORDER — OXYCODONE-ACETAMINOPHEN 5-325 MG PO TABS
2.0000 | ORAL_TABLET | Freq: Once | ORAL | Status: AC
  Administered 2015-02-17: 2 via ORAL
  Filled 2015-02-17: qty 2

## 2015-02-17 MED ORDER — IBUPROFEN 800 MG PO TABS: 800.0000 mg | ORAL_TABLET | Freq: Three times a day (TID) | ORAL | Status: DC
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
## 2015-02-17 NOTE — ED Notes (Signed)
Pt verbalized understanding DC instructions and prescriptions

## 2015-02-17 NOTE — Discharge Instructions (Signed)
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
## 2015-03-10 ENCOUNTER — Emergency Department (HOSPITAL_COMMUNITY)
Admission: EM | Admit: 2015-03-10 | Discharge: 2015-03-10 | Disposition: A | Discharge status: Home / Self Care | Payer: Self-pay | Attending: Emergency Medicine | Admitting: Emergency Medicine

## 2015-03-10 ENCOUNTER — Encounter (HOSPITAL_COMMUNITY): Payer: Self-pay | Admitting: Emergency Medicine

## 2015-03-10 DIAGNOSIS — Z791 Long term (current) use of non-steroidal anti-inflammatories (NSAID): Secondary | ICD-10-CM | POA: Insufficient documentation

## 2015-03-10 DIAGNOSIS — L0501 Pilonidal cyst with abscess: Secondary | ICD-10-CM | POA: Insufficient documentation

## 2015-03-10 DIAGNOSIS — J45909 Unspecified asthma, uncomplicated: Secondary | ICD-10-CM | POA: Insufficient documentation

## 2015-03-10 DIAGNOSIS — Z79899 Other long term (current) drug therapy: Secondary | ICD-10-CM | POA: Insufficient documentation

## 2015-03-10 DIAGNOSIS — F17210 Nicotine dependence, cigarettes, uncomplicated: Secondary | ICD-10-CM | POA: Insufficient documentation

## 2015-03-10 MED ORDER — LIDOCAINE-EPINEPHRINE (PF) 2 %-1:200000 IJ SOLN
10.0000 mL | Freq: Once | INTRAMUSCULAR | Status: AC
  Administered 2015-03-10: 10 mL
  Filled 2015-03-10: qty 20

## 2015-03-10 MED ORDER — IBUPROFEN 600 MG PO TABS: 600.0000 mg | ORAL_TABLET | Freq: Four times a day (QID) | ORAL | Status: DC | PRN

## 2015-03-10 MED ORDER — TRAMADOL HCL 50 MG PO TABS: 50.0000 mg | ORAL_TABLET | Freq: Four times a day (QID) | ORAL | Status: DC | PRN

## 2015-03-10 MED ORDER — SULFAMETHOXAZOLE-TRIMETHOPRIM 800-160 MG PO TABS: 1.0000 | ORAL_TABLET | Freq: Two times a day (BID) | ORAL | Status: AC

## 2015-03-10 NOTE — ED Provider Notes (Signed)
CSN: [id number]     Arrival date & time 03/10/15  2030 History  By signing my name below, I, Lyndel Safe, attest that this documentation has been prepared under the direction and in the presence of Magdeline Prange, PA-C. Electronically Signed: Lyndel Safe, ED Scribe. 03/10/2015. 9:29 PM.   Chief Complaint  Patient presents with  . Abscess    The history is provided by the patient. No language interpreter was used.   HPI Comments: Alejandro Elliott is a 24 y.o. male, with a PMhx of pilonidal cyst, who presents to the Emergency Department complaining of a gradually worsening area of pain, swelling, and erythema to right gluteal cleft onset 3 days ago. Pt states the area 'has a head on it'. He notes a history of pilonidal cyst in the same area as his current abscess. He has been taking hot showers but has not applied any warm compresses or medication to the affected area. Denies fevers or chills.   Past Medical History  Diagnosis Date  . Asthma    Past Surgical History  Procedure Laterality Date  . Mandible fracture surgery     Family History  Problem Relation Age of Onset  . Seizures Sister    Social History  Substance Use Topics  . Smoking status: Current Every Day Smoker    Types: Cigarettes  . Smokeless tobacco: Never Used  . Alcohol Use: No    Review of Systems  Constitutional: Negative for fever and chills.  Skin: Positive for wound ( abscess to right side of gluteal cleft ).   Allergies  Review of patient's allergies indicates no known allergies.  Home Medications   Prior to Admission medications   Medication Sig Start Date End Date Taking? Authorizing Provider  albuterol (PROVENTIL HFA;VENTOLIN HFA) 108 (90 BASE) MCG/ACT inhaler Inhale 2 puffs into the lungs every 6 (six) hours as needed for wheezing or shortness of breath.     Historical Provider, MD  HYDROcodone-acetaminophen (NORCO/VICODIN) 5-325 MG tablet Take 2 tablets by mouth every 4 (four) hours as  needed. 02/17/15   Eber Hong, MD  ibuprofen (ADVIL,MOTRIN) 800 MG tablet Take 1 tablet (800 mg total) by mouth 3 (three) times daily. 02/17/15   Eber Hong, MD  magic mouthwash w/lidocaine SOLN Take 5 mLs by mouth 4 (four) times daily as needed for mouth pain. 02/12/15   Antony Madura, PA-C   BP 123/78 mmHg  Pulse 92  Temp(Src) 98.6 F (37 C) (Oral)  Resp 16  Ht 6' (1.829 m)  Wt 222 lb (100.699 kg)  BMI 30.10 kg/m2  SpO2 100% Physical Exam  Constitutional: He is oriented to person, place, and time. He appears well-developed and well-nourished. No distress.  HENT:  Head: Normocephalic.  Eyes: Conjunctivae are normal.  Neck: Normal range of motion. Neck supple.  Cardiovascular: Normal rate.   Pulmonary/Chest: Effort normal. No respiratory distress.  Musculoskeletal: Normal range of motion.  Neurological: He is alert and oriented to person, place, and time. Coordination normal.  Skin: Skin is warm.  2 x 2 centimeter pilonidal abscess with no surrounding cellulitis. Tender to palpation. Fluctuant, swollen, no drainage.  Psychiatric: He has a normal mood and affect. His behavior is normal.  Nursing note and vitals reviewed.   ED Course  Procedures  DIAGNOSTIC STUDIES: Oxygen Saturation is 100% on RA, normal by my interpretation.    COORDINATION OF CARE: 8:54 PM Discussed treatment plan which includes to perform I&D procedure with pt. Pt acknowledges and agrees to plan.  INCISION AND DRAINAGE PROCEDURE NOTE: Patient identification was confirmed and verbal consent was obtained. This procedure was performed by Jaynie Crumbleatyana Printice Hellmer, PA-C at 9:13 PM. Site: top of gluteal cleft Sterile procedures observed Anesthetic used (type and amt): 3cc of lidocaine 2% with epinephrine  Blade size: 11 Drainage: copious purulent drainage  Packing used  Site anesthetized, incision made over site, wound drained and explored loculations, rinsed with copious amounts of normal saline, wound packed  with sterile gauze, covered with dry, sterile dressing.  Pt tolerated procedure well without complications.  Instructions for care discussed verbally and pt provided with additional written instructions for homecare and f/u.   MDM   Final diagnoses:  Pilonidal abscess    Patient with skin abscess. Incision and drainage performed in the ED today.  Packing used.  Wound recheck in 2 days. Supportive care and return precautions discussed.  Pt sent home with antibiotic course, Bactrim. The patient appears reasonably screened and/or stabilized for discharge and I doubt any other emergent medical condition requiring further screening, evaluation, or treatment in the ED prior to discharge.  Filed Vitals:   03/10/15 2034  BP: 123/78  Pulse: 92  Temp: 98.6 F (37 C)  TempSrc: Oral  Resp: 16  Height: 6' (1.829 m)  Weight: 100.699 kg  SpO2: 100%     Jaynie Crumbleatyana Cherisse Carrell, PA-C 03/11/15 0057  Bethann BerkshireJoseph Zammit, MD 03/12/15 [phone]

## 2015-03-10 NOTE — ED Notes (Signed)
PA at bedside to drain abcess.

## 2015-03-10 NOTE — Discharge Instructions (Signed)
Ibuprofen and tramadol for pain. Bactrim was prescribed until gone for infection. Rule out packing in 2 days. Come back in 2 days if not improving, otherwise warm baths, wound care, follow-up as needed  Pilonidal Cyst A pilonidal cyst is a fluid-filled sac. It forms beneath the skin near your tailbone, at the top of the crease of your buttocks. A pilonidal cyst that is not large or infected may not cause symptoms or problems. If the cyst becomes irritated or infected, it may fill with pus. This causes pain and swelling (pilonidal abscess). An infected cyst may need to be treated with medicine, drained, or removed. CAUSES The cause of a pilonidal cyst is not known. One cause may be a hair that grows into your skin (ingrown hair). RISK FACTORS Pilonidal cysts are more common in boys and men. Risk factors include:  Having lots of hair near the crease of the buttocks.  Being overweight.  Having a pilonidal dimple.  Wearing tight clothing.  Not bathing or showering frequently.  Sitting for long periods of time. SIGNS AND SYMPTOMS Signs and symptoms of a pilonidal cyst may include:  Redness.  Pain and tenderness.  Warmth.  Swelling.  Pus.  Fever. DIAGNOSIS Your health care provider may diagnose a pilonidal cyst based on your symptoms and a physical exam. The health care provider may do a blood test to check for infection. If your cyst is draining pus, your health care provider may take a sample of the drainage to be tested at a laboratory. TREATMENT Surgery is the usual treatment for an infected pilonidal cyst. You may also have to take medicines before surgery. The type of surgery you have depends on the size and severity of the infected cyst. The different kinds of surgery include:  Incision and drainage. This is a procedure to open and drain the cyst.  Marsupialization. In this procedure, a large cyst or abscess may be opened and kept open by stitching the edges of the skin to  the cyst walls.  Cyst removal. This procedure involves opening the skin and removing all or part of the cyst. HOME CARE INSTRUCTIONS  Follow all of your surgeon's instructions carefully if you had surgery.  Take medicines only as directed by your health care provider.  If you were prescribed an antibiotic medicine, finish it all even if you start to feel better.  Keep the area around your pilonidal cyst clean and dry.  Clean the area as directed by your health care provider. Pat the area dry with a clean towel. Do not rub it as this may cause bleeding.  Remove hair from the area around the cyst as directed by your health care provider.  Do not wear tight clothing or sit in one place for long periods of time.  There are many different ways to close and cover an incision, including stitches, skin glue, and adhesive strips. Follow your health care provider's instructions on:  Incision care.  Bandage (dressing) changes and removal.  Incision closure removal. SEEK MEDICAL CARE IF:   You have drainage, redness, swelling, or pain at the site of the cyst.  You have a fever.   This information is not intended to replace advice given to you by your health care provider. Make sure you discuss any questions you have with your health care provider.   Document Released: 02/29/2000 Document Revised: 03/24/2014 Document Reviewed: 07/21/2013 Elsevier Interactive Patient Education [employer]2016 [employer]

## 2015-03-10 NOTE — ED Notes (Signed)
Pt. presents with abscess at upper mid buttocks onset 3 days ago with no drainage .

## 2015-03-12 ENCOUNTER — Emergency Department (HOSPITAL_COMMUNITY)
Admission: EM | Admit: 2015-03-12 | Discharge: 2015-03-12 | Disposition: A | Discharge status: Home / Self Care | Payer: Self-pay | Attending: Emergency Medicine | Admitting: Emergency Medicine

## 2015-03-12 ENCOUNTER — Encounter (HOSPITAL_COMMUNITY): Payer: Self-pay | Admitting: *Deleted

## 2015-03-12 DIAGNOSIS — F17210 Nicotine dependence, cigarettes, uncomplicated: Secondary | ICD-10-CM | POA: Insufficient documentation

## 2015-03-12 DIAGNOSIS — J45909 Unspecified asthma, uncomplicated: Secondary | ICD-10-CM | POA: Insufficient documentation

## 2015-03-12 DIAGNOSIS — Z79899 Other long term (current) drug therapy: Secondary | ICD-10-CM | POA: Insufficient documentation

## 2015-03-12 DIAGNOSIS — Z5189 Encounter for other specified aftercare: Secondary | ICD-10-CM

## 2015-03-12 DIAGNOSIS — Z4801 Encounter for change or removal of surgical wound dressing: Secondary | ICD-10-CM | POA: Insufficient documentation

## 2015-03-12 DIAGNOSIS — Z792 Long term (current) use of antibiotics: Secondary | ICD-10-CM | POA: Insufficient documentation

## 2015-03-12 NOTE — ED Notes (Signed)
Declined W/C at D/C and was escorted to lobby by RN. 

## 2015-03-12 NOTE — ED Notes (Signed)
PT did not fill the antibx . RX given on 03-10-15

## 2015-03-12 NOTE — Discharge Instructions (Signed)
Get antibiotic prescriptions filled and take as prescribed. May wash area with soap and water. Encourage taking showers instead of baths. Apply gauze to wound to keep dry. Return to the ED if you experience fever, chills, severe increase in your pain, increased redness/swelling/drainage of affected area.

## 2015-03-12 NOTE — ED Notes (Signed)
Pt provided with information on cost of meds . [facility] - med is free. [facility]

## 2015-03-12 NOTE — ED Provider Notes (Signed)
CSN: [id number]     Arrival date & time 03/12/15  4098 History   First MD Initiated Contact with Patient 03/12/15 [phone]     Chief Complaint  Patient presents with  . Wound Check     (Consider location/radiation/quality/duration/timing/severity/associated sxs/prior Treatment) HPI   Alejandro Elliott is a 24 y.o M who presents to the ED today for a wound re-check. Pt with a pilonidal cyst that was drained and packed on 12/24. Patient was also given prescription for Bactrim at this time which he has not filled. Patient states his pain has greatly improved. Denies fever, chills, increased redness or swelling, vomiting. Patient has been taking showers but does not apply any warm compresses or medication to the affected area.  Past Medical History  Diagnosis Date  . Asthma    Past Surgical History  Procedure Laterality Date  . Mandible fracture surgery     Family History  Problem Relation Age of Onset  . Seizures Sister    Social History  Substance Use Topics  . Smoking status: Current Every Day Smoker    Types: Cigarettes  . Smokeless tobacco: Never Used  . Alcohol Use: No    Review of Systems  All other systems reviewed and are negative.     Allergies  Review of patient's allergies indicates no known allergies.  Home Medications   Prior to Admission medications   Medication Sig Start Date End Date Taking? Authorizing Provider  albuterol (PROVENTIL HFA;VENTOLIN HFA) 108 (90 BASE) MCG/ACT inhaler Inhale 2 puffs into the lungs every 6 (six) hours as needed for wheezing or shortness of breath.    Yes Historical Provider, MD  HYDROcodone-acetaminophen (NORCO/VICODIN) 5-325 MG tablet Take 2 tablets by mouth every 4 (four) hours as needed. 02/17/15  Yes Eber Hong, MD  ibuprofen (ADVIL,MOTRIN) 600 MG tablet Take 1 tablet (600 mg total) by mouth every 6 (six) hours as needed. 03/10/15  Yes Tatyana Kirichenko, PA-C  magic mouthwash w/lidocaine SOLN Take 5 mLs by mouth 4 (four) times  daily as needed for mouth pain. 02/12/15  Yes Antony Madura, PA-C  sulfamethoxazole-trimethoprim (BACTRIM DS,SEPTRA DS) 800-160 MG tablet Take 1 tablet by mouth 2 (two) times daily. 03/10/15 03/17/15 Yes Tatyana Kirichenko, PA-C  traMADol (ULTRAM) 50 MG tablet Take 1 tablet (50 mg total) by mouth every 6 (six) hours as needed. 03/10/15  Yes Tatyana Kirichenko, PA-C   BP 121/64 mmHg  Pulse 77  Temp(Src) 98.2 F (36.8 C) (Oral)  Resp 18  Ht 6' (1.829 m)  Wt 102.694 kg  BMI 30.70 kg/m2  SpO2 97% Physical Exam  Constitutional: He is oriented to person, place, and time. He appears well-developed and well-nourished. No distress.  HENT:  Head: Normocephalic and atraumatic.  Eyes: Conjunctivae are normal. Right eye exhibits no discharge. Left eye exhibits no discharge. No scleral icterus.  Cardiovascular: Normal rate.   Pulmonary/Chest: Effort normal.  Neurological: He is alert and oriented to person, place, and time. Coordination normal.  Skin: Skin is warm and dry. No rash noted. He is not diaphoretic. No erythema. No pallor.  2 cm pilonidal cyst at the gluteal cleft that has been previously drained. Tender to palpation. No surrounding cellulitis. No active drainage. Appears to be healing well.   Psychiatric: He has a normal mood and affect. His behavior is normal.  Nursing note and vitals reviewed.   ED Course  Procedures (including critical care time) Labs Review Labs Reviewed - No data to display  Imaging Review No results found. I  have personally reviewed and evaluated these images and lab results as part of my medical decision-making.   EKG Interpretation None      MDM   Final diagnoses:  Wound check, abscess    Patient here for wound recheck and packing removal of previously drained pilonidal cyst. Area appears to be healing well. No cerumen cellulitis. No systemic symptoms. Afebrile. Packing removed today which patient tolerated well. Patient has not filled his  antibiotic prescription that was given a last visit. Heavily encourage patient to fill his antibiotic prescription. Instructed patient on how to properly care for this wound. Patient given strict return precautions which are outlined in the patient discharge instructions.     Lester KinsmanSamantha Tripp [place], PA-C 03/12/15 0750  Linwood DibblesJon Knapp, MD 03/12/15 1001

## 2015-03-12 NOTE — ED Notes (Signed)
Pt presents for packing removal from abscess that was treated on 03-10-15

## 2016-02-17 ENCOUNTER — Emergency Department (HOSPITAL_COMMUNITY)
Admission: EM | Admit: 2016-02-17 | Discharge: 2016-02-17 | Disposition: A | Discharge status: Home / Self Care | Payer: Self-pay | Attending: Emergency Medicine | Admitting: Emergency Medicine

## 2016-02-17 ENCOUNTER — Encounter (HOSPITAL_COMMUNITY): Payer: Self-pay | Admitting: *Deleted

## 2016-02-17 DIAGNOSIS — J45909 Unspecified asthma, uncomplicated: Secondary | ICD-10-CM | POA: Insufficient documentation

## 2016-02-17 DIAGNOSIS — F17210 Nicotine dependence, cigarettes, uncomplicated: Secondary | ICD-10-CM | POA: Insufficient documentation

## 2016-02-17 DIAGNOSIS — Z79899 Other long term (current) drug therapy: Secondary | ICD-10-CM | POA: Insufficient documentation

## 2016-02-17 DIAGNOSIS — R569 Unspecified convulsions: Secondary | ICD-10-CM | POA: Insufficient documentation

## 2016-02-17 MED ORDER — LORAZEPAM 1 MG PO TABS
1.0000 mg | ORAL_TABLET | Freq: Once | ORAL | Status: AC
  Administered 2016-02-17: 1 mg via ORAL
  Filled 2016-02-17: qty 1

## 2016-02-17 NOTE — ED Provider Notes (Signed)
[facility]-EMERGENCY DEPT Provider Note   CSN: [id number] Arrival date & time: 02/17/16  1841     History   Chief Complaint Chief Complaint  Patient presents with  . Drug Overdose    HPI Chancy MilroyCarl E Cuen is a 25 y.o. male.  HPI Patient presents with concern of altered mental status. He presents from home where he was found unresponsive by family members per Patient himself states that he thinks he had a seizure, though he also had he has had a substantial amount of alcohol to drink today. He notes a seizure history, takes no medication for this, has not seen a neurologist. He denies physical pain anywhere, states that he was generally well aside from substantial life stress, causing him to drink substantial alcohol earlier today.  Amer sports patient was found unresponsive by family members, became more interactive in route.  Past Medical History:  Diagnosis Date  . Asthma     There are no active problems to display for this patient.   Past Surgical History:  Procedure Laterality Date  . MANDIBLE FRACTURE SURGERY         Home Medications    Prior to Admission medications   Medication Sig Start Date End Date Taking? Authorizing Provider  albuterol (PROVENTIL HFA;VENTOLIN HFA) 108 (90 BASE) MCG/ACT inhaler Inhale 2 puffs into the lungs every 6 (six) hours as needed for wheezing or shortness of breath.     Historical Provider, MD  HYDROcodone-acetaminophen (NORCO/VICODIN) 5-325 MG tablet Take 2 tablets by mouth every 4 (four) hours as needed. 02/17/15   Eber HongBrian Miller, MD  ibuprofen (ADVIL,MOTRIN) 600 MG tablet Take 1 tablet (600 mg total) by mouth every 6 (six) hours as needed. 03/10/15   Tatyana Kirichenko, PA-C  magic mouthwash w/lidocaine SOLN Take 5 mLs by mouth 4 (four) times daily as needed for mouth pain. 02/12/15   Antony MaduraKelly Humes, PA-C  traMADol (ULTRAM) 50 MG tablet Take 1 tablet (50 mg total) by mouth every 6 (six) hours as needed. 03/10/15   Jaynie Crumbleatyana Kirichenko, PA-C     Family History Family History  Problem Relation Age of Onset  . Seizures Sister     Social History Social History  Substance Use Topics  . Smoking status: Current Every Day Smoker    Types: Cigarettes  . Smokeless tobacco: Never Used  . Alcohol use Yes     Comment: arrived intoxicated     Allergies   Patient has no known allergies.   Review of Systems Review of Systems  Constitutional:       Per HPI, otherwise negative  HENT:       Per HPI, otherwise negative  Respiratory:       Per HPI, otherwise negative  Cardiovascular:       Per HPI, otherwise negative  Gastrointestinal: Negative for vomiting.  Endocrine:       Negative aside from HPI  Genitourinary:       Neg aside from HPI   Musculoskeletal:       Per HPI, otherwise negative  Skin: Negative.   Neurological: Positive for seizures. Negative for syncope.     Physical Exam Updated Vital Signs Wt 226 lb (102.5 kg)   BMI 30.65 kg/m   Physical Exam  Constitutional: He is oriented to person, place, and time. He appears well-developed. No distress.  HENT:  Head: Normocephalic and atraumatic.  Eyes: Conjunctivae and EOM are normal.  Cardiovascular: Normal rate and regular rhythm.   Pulmonary/Chest: Effort normal. No stridor. No respiratory  distress.  Abdominal: He exhibits no distension.  Musculoskeletal: He exhibits no edema.  Neurological: He is alert and oriented to person, place, and time.  Skin: Skin is warm and dry.  Psychiatric: He has a normal mood and affect.  Nursing note and vitals reviewed.    ED Treatments / Results   Procedures Procedures (including critical care time)  Medications Ordered in ED Medications  LORazepam (ATIVAN) tablet 1 mg (not administered)     Initial Impression / Assessment and Plan / ED Course  I have reviewed the triage vital signs and the nursing notes.  Pertinent labs & imaging results that were available during my care of the patient were reviewed  by me and considered in my medical decision making (see chart for details).  Clinical Course     Patient was awake and alert, sober appearing on repeat evaluation. \Patient tolerated oral intake. Subsequently, the patient left prior to his additional repeat evaluation. Though there was no evidence for acute distress, and the patient likely had a seizure, he did not have a chance to receive his discharge paperwork.   Gerhard Munchobert Gennette Shadix, MD 02/17/16 2101

## 2016-02-17 NOTE — ED Triage Notes (Signed)
EMS brings pt in, reports that family called them for intoxication.  Pt has had etoh, cocaine and thc today.  He was found unresponsive on the couch by his brother, pt was lowered to the ground by family.  Pt has hx of seizures also but no seizure like activity was seen today. Pt was GCS 3 for ems.  Pt becomes alert and answers my questions on arrival.  When asked what happened he states "I've been drinking"

## 2016-02-17 NOTE — ED Notes (Signed)
Other RN notified me that pt pulled his IV out and placed this on the bed and walked out with family, pt appeared sober at the time and had 1L NS before leaving and had eaten a small meal.

## 2016-02-17 NOTE — ED Notes (Signed)
Bed: RESB Expected date:  Expected time:  Means of arrival:  Comments: EMS ETOH

## 2016-04-23 ENCOUNTER — Encounter (HOSPITAL_COMMUNITY): Payer: Self-pay | Admitting: Emergency Medicine

## 2016-04-23 ENCOUNTER — Emergency Department (HOSPITAL_COMMUNITY)
Admission: EM | Admit: 2016-04-23 | Discharge: 2016-04-23 | Disposition: A | Discharge status: Home / Self Care | Payer: Self-pay | Attending: Emergency Medicine | Admitting: Emergency Medicine

## 2016-04-23 DIAGNOSIS — Z79899 Other long term (current) drug therapy: Secondary | ICD-10-CM | POA: Insufficient documentation

## 2016-04-23 DIAGNOSIS — F17210 Nicotine dependence, cigarettes, uncomplicated: Secondary | ICD-10-CM | POA: Insufficient documentation

## 2016-04-23 DIAGNOSIS — L0501 Pilonidal cyst with abscess: Secondary | ICD-10-CM

## 2016-04-23 DIAGNOSIS — L0591 Pilonidal cyst without abscess: Secondary | ICD-10-CM | POA: Insufficient documentation

## 2016-04-23 DIAGNOSIS — J45909 Unspecified asthma, uncomplicated: Secondary | ICD-10-CM | POA: Insufficient documentation

## 2016-04-23 HISTORY — DX: Pilonidal cyst without abscess: L05.91

## 2016-04-23 MED ORDER — LIDOCAINE-EPINEPHRINE (PF) 2 %-1:200000 IJ SOLN
10.0000 mL | Freq: Once | INTRAMUSCULAR | Status: AC
  Administered 2016-04-23: 10 mL
  Filled 2016-04-23: qty 20

## 2016-04-23 MED ORDER — SULFAMETHOXAZOLE-TRIMETHOPRIM 800-160 MG PO TABS: 1.0000 | ORAL_TABLET | Freq: Two times a day (BID) | ORAL | 0 refills | Status: AC

## 2016-04-23 NOTE — ED Triage Notes (Addendum)
C/o pilonidal cyst to right side of buttocks x 4 days.  Reports history of same.

## 2016-04-23 NOTE — ED Provider Notes (Signed)
[facility]-EMERGENCY DEPT Provider Note   CSN: [id number] Arrival date & time: 04/23/16  1846  By signing my name below, I, Linna DarnerRussell Turner, attest that this documentation has been prepared under the direction and in the presence of Melburn HakeNicole Chatara Lucente, [place]. Electronically Signed: Linna Darnerussell Turner, Scribe. 04/23/2016. 9:08 PM.  History   Chief Complaint Chief Complaint  Patient presents with  . Cyst    The history is provided by the patient. No language interpreter was used.     HPI Comments: Alejandro Elliott is a 26 y.o. male who presents to the Emergency Department complaining of a gradually worsening area of pain and swelling to his tailbone beginning 3 days ago. 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 Pt endorses associated pain with ambulation and applied pressure to the area. He has taken warm showers with no improvement of his symptoms and states he has been trying to keep the area clean. No regular medications. He denies spontaneous drainage, fever, chills, abdominal pain, nausea, vomiting, rectal pain, pain with bowel movements, or any other associated symptoms.  Past Medical History:  Diagnosis Date  . Asthma   . Cyst near tailbone     There are no active problems to display for this patient.   Past Surgical History:  Procedure Laterality Date  . MANDIBLE FRACTURE SURGERY         Home Medications    Prior to Admission medications   Medication Sig Start Date End Date Taking? Authorizing Provider  albuterol (PROVENTIL HFA;VENTOLIN HFA) 108 (90 BASE) MCG/ACT inhaler Inhale 2 puffs into the lungs every 6 (six) hours as needed for wheezing or shortness of breath.     Historical Provider, MD  HYDROcodone-acetaminophen (NORCO/VICODIN) 5-325 MG tablet Take 2 tablets by mouth every 4 (four) hours as needed. 02/17/15   Eber HongBrian Miller, MD  ibuprofen  (ADVIL,MOTRIN) 600 MG tablet Take 1 tablet (600 mg total) by mouth every 6 (six) hours as needed. 03/10/15   Tatyana Kirichenko, PA-C  magic mouthwash w/lidocaine SOLN Take 5 mLs by mouth 4 (four) times daily as needed for mouth pain. 02/12/15   Antony MaduraKelly Humes, PA-C  sulfamethoxazole-trimethoprim (BACTRIM DS,SEPTRA DS) 800-160 MG tablet Take 1 tablet by mouth 2 (two) times daily. 04/23/16 04/30/16  Barrett HenleNicole Elizabeth Amran Malter, PA-C  traMADol (ULTRAM) 50 MG tablet Take 1 tablet (50 mg total) by mouth every 6 (six) hours as needed. 03/10/15   Jaynie Crumbleatyana Kirichenko, PA-C    Family History Family History  Problem Relation Age of Onset  . Seizures Sister     Social History Social History  Substance Use Topics  . Smoking status: Current Every Day Smoker    Types: Cigarettes  . Smokeless tobacco: Never Used  . Alcohol use Yes     Allergies   Patient has no known allergies.   Review of Systems Review of Systems  Constitutional: Negative for chills and fever.  Gastrointestinal: Negative for abdominal pain, nausea, rectal pain and vomiting.  Musculoskeletal: Positive for myalgias.  Skin: Positive for wound.     Physical Exam Updated Vital Signs BP 125/70 (BP Location: Left Arm)   Pulse 81   Temp 98.4 F (36.9 C) (Oral)   Resp 16   Ht 6' (1.829 m)   Wt 103.4 kg   SpO2 99%   BMI 30.92 kg/m   Physical Exam  Constitutional: He  is oriented to person, place, and time. He appears well-developed and well-nourished.  HENT:  Head: Normocephalic and atraumatic.  Eyes: Conjunctivae and EOM are normal. Right eye exhibits no discharge. Left eye exhibits no discharge. No scleral icterus.  Neck: Normal range of motion. Neck supple.  Cardiovascular: Normal rate, regular rhythm, normal heart sounds and intact distal pulses.   Pulmonary/Chest: Effort normal and breath sounds normal.  Abdominal: Soft. Bowel sounds are normal. He exhibits no distension and no mass. There is no tenderness. There is no  rebound and no guarding. No hernia.  Genitourinary: Rectum normal.  Musculoskeletal: Normal range of motion. He exhibits no edema.  Neurological: He is alert and oriented to person, place, and time.  Skin: Skin is warm and dry.  3 x 2 cm area of fluctuance noted to the right sacral region with mild tenderness. No surrounding swelling, erythema, warmth, or drainage.  Nursing note and vitals reviewed.    ED Treatments / Results  Labs (all labs ordered are listed, but only abnormal results are displayed) Labs Reviewed - No data to display  EKG  EKG Interpretation None       Radiology No results found.  Procedures .Marland KitchenIncision and Drainage Date/Time: 04/23/2016 9:59 PM Performed by: Barrett Henle Authorized by: Barrett Henle   Consent:    Consent obtained:  Verbal   Consent given by:  Patient Location:    Type:  Pilonidal cyst   Size:  3x2cm   Location: sacral region. Pre-procedure details:    Skin preparation:  Betadine Anesthesia (see MAR for exact dosages):    Anesthesia method:  Local infiltration   Local anesthetic:  Lidocaine 2% WITH epi Procedure type:    Complexity:  Simple Procedure details:    Incision types:  Single straight   Incision depth:  Dermal   Scalpel blade:  11   Wound management:  Probed and deloculated and irrigated with saline   Drainage:  Purulent and bloody   Drainage amount:  Moderate   Wound treatment:  Wound left open   Packing materials:  None Post-procedure details:    Patient tolerance of procedure:  Tolerated well, no immediate complications   (including critical care time)  DIAGNOSTIC STUDIES: Oxygen Saturation is 99% on RA, normal by my interpretation.    COORDINATION OF CARE: 9:13 PM Discussed treatment plan with pt at bedside and pt agreed to plan.  Medications Ordered in ED Medications  lidocaine-EPINEPHrine (XYLOCAINE W/EPI) 2 %-1:200000 (PF) injection 10 mL (10 mLs Infiltration Given 04/23/16 2135)       Initial Impression / Assessment and Plan / ED Course  I have reviewed the triage vital signs and the nursing notes.  Pertinent labs & imaging results that were available during my care of the patient were reviewed by me and considered in my medical decision making (see chart for details).     Patient with pilonidal abscess amenable to incision and drainage.  Abscess was not large enough to warrant packing or drain,  wound recheck in 2 days. Encouraged home warm soaks and flushing.  Pt d/c home with Bactrim due to hx of recurrent abscesses. Will d/c to home wound care and discussed return precautions. Advised pt to follow up with PCP or in the ED in 24-48 hours for wound recheck.   Final Clinical Impressions(s) / ED Diagnoses   Final diagnoses:  Pilonidal abscess    New Prescriptions New Prescriptions   SULFAMETHOXAZOLE-TRIMETHOPRIM (BACTRIM DS,SEPTRA DS) 800-160 MG TABLET  Take 1 tablet by mouth 2 (two) times daily.   I personally performed the services described in this documentation, which was scribed in my presence. The recorded information has been reviewed and is accurate.    Satira Sark Aldan, [place] 04/23/16 2201    Shaune Pollack, MD 04/25/16 [phone]

## 2016-04-23 NOTE — Discharge Instructions (Signed)
Keep your wound clean using antibacterial soap and and water, pat dry. You may also apply warm compresses to area for 15-20 minutes 3-4 times daily. You may also take 600mg  Ibuprofen every 6 hours as needed.  Please follow up with a primary care provider from the Resource Guide provided below in 24-48 hours for wound recheck as needed. Please return to the Emergency Department if symptoms worsen or new onset of fever, abdominal pain, vomiting, rectal pain, rectal bleeding, swelling, redness, warmth.

## 2016-07-12 ENCOUNTER — Emergency Department (HOSPITAL_COMMUNITY)
Admission: EM | Admit: 2016-07-12 | Discharge: 2016-07-12 | Disposition: A | Discharge status: Home / Self Care | Payer: Self-pay | Attending: Emergency Medicine | Admitting: Emergency Medicine

## 2016-07-12 ENCOUNTER — Encounter (HOSPITAL_COMMUNITY): Payer: Self-pay | Admitting: *Deleted

## 2016-07-12 DIAGNOSIS — F17210 Nicotine dependence, cigarettes, uncomplicated: Secondary | ICD-10-CM | POA: Insufficient documentation

## 2016-07-12 DIAGNOSIS — J45909 Unspecified asthma, uncomplicated: Secondary | ICD-10-CM | POA: Insufficient documentation

## 2016-07-12 DIAGNOSIS — Z202 Contact with and (suspected) exposure to infections with a predominantly sexual mode of transmission: Secondary | ICD-10-CM | POA: Insufficient documentation

## 2016-07-12 NOTE — ED Provider Notes (Signed)
[facility]-EMERGENCY DEPT Provider Note   CSN: [id number] Arrival date & time: 07/12/16  1318  By signing my name below, I, Majel Homer, attest that this documentation has been prepared under the direction and in the presence of non-physician practitioner, Ok Edwards, PA-C. Electronically Signed: Majel Homer, Scribe. 07/12/2016. 2:23 PM.  History   Chief Complaint Chief Complaint  Patient presents with  . Exposure to STD   The history is provided by the patient. No language interpreter was used.   HPI Comments: Alejandro Elliott is a 26 y.o. male who presents to the Emergency Department for an evaluation s/p exposure to a sexual partner with a "1% positive HIV diagnosis." Pt is also requesting testing for other possible STDs. He denies any recent sickness, penile pain, and penile discharge.   Past Medical History:  Diagnosis Date  . Asthma   . Cyst near tailbone    There are no active problems to display for this patient.  Past Surgical History:  Procedure Laterality Date  . MANDIBLE FRACTURE SURGERY      Home Medications    Prior to Admission medications   Medication Sig Start Date End Date Taking? Authorizing Provider  albuterol (PROVENTIL HFA;VENTOLIN HFA) 108 (90 BASE) MCG/ACT inhaler Inhale 2 puffs into the lungs every 6 (six) hours as needed for wheezing or shortness of breath.     Historical Provider, MD  HYDROcodone-acetaminophen (NORCO/VICODIN) 5-325 MG tablet Take 2 tablets by mouth every 4 (four) hours as needed. 02/17/15   Eber Hong, MD  ibuprofen (ADVIL,MOTRIN) 600 MG tablet Take 1 tablet (600 mg total) by mouth every 6 (six) hours as needed. 03/10/15   Tatyana Kirichenko, PA-C  magic mouthwash w/lidocaine SOLN Take 5 mLs by mouth 4 (four) times daily as needed for mouth pain. 02/12/15   Antony Madura, PA-C  traMADol (ULTRAM) 50 MG tablet Take 1 tablet (50 mg total) by mouth every 6 (six) hours as needed. 03/10/15   Jaynie Crumble, PA-C   Family History Family  History  Problem Relation Age of Onset  . Seizures Sister    Social History Social History  Substance Use Topics  . Smoking status: Current Every Day Smoker    Types: Cigarettes  . Smokeless tobacco: Never Used  . Alcohol use Yes   Allergies   Patient has no known allergies.  Review of Systems Review of Systems  Constitutional: Negative for fever.  Genitourinary: Negative for discharge and penile pain.   Physical Exam Updated Vital Signs BP (!) 130/55 (BP Location: Left Arm)   Pulse 88   Temp 98.3 F (36.8 C) (Oral)   Resp 20   Ht  (1.854 m)   Wt 230 lb (104.3 kg)   SpO2 100%   BMI 30.34 kg/m   Physical Exam  Constitutional: He is oriented to person, place, and time. He appears well-developed and well-nourished.  HENT:  Head: Normocephalic.  Eyes: EOM are normal.  Neck: Normal range of motion.  Pulmonary/Chest: Effort normal.  Abdominal: He exhibits no distension.  Musculoskeletal: Normal range of motion.  Neurological: He is alert and oriented to person, place, and time.  Psychiatric: He has a normal mood and affect.  Nursing note and vitals reviewed.  ED Treatments / Results  DIAGNOSTIC STUDIES:  Oxygen Saturation is 100% on RA, normal by my interpretation.    COORDINATION OF CARE:  2:12 PM Discussed treatment plan with pt at bedside and pt agreed to plan.  Labs (all labs ordered are listed, but  only abnormal results are displayed) Labs Reviewed  HIV ANTIBODY (ROUTINE TESTING)  RPR  GC/CHLAMYDIA PROBE AMP ([place]) NOT AT [facility]    EKG  EKG Interpretation None       Radiology No results found.  Procedures Procedures (including critical care time)  Medications Ordered in ED Medications - No data to display  Initial Impression / Assessment and Plan / ED Course  I have reviewed the triage vital signs and the nursing notes.  Pertinent labs & imaging results that were available during my care of the patient were reviewed by me and  considered in my medical decision making (see chart for details).     Pt arrives for asymptomatic STD check.  Discussed safe sexual practices. Pt is advised to follow up for free testing at local health department in the future. Pt appears safe for discharge.     Final Clinical Impressions(s) / ED Diagnoses   Final diagnoses:  Exposure to STD    New Prescriptions Discharge Medication List as of 07/12/2016  2:28 PM    An After Visit Summary was printed and given to the patient.  I personally performed the services in this documentation, which was scribed in my presence.  The recorded information has been reviewed and considered.   Barnet Pall.   Lonia Skinner Corder, PA-C 07/12/16 1523    Doug Sou, MD 07/12/16 [phone]

## 2016-07-12 NOTE — ED Triage Notes (Signed)
Pt is requesting Hiv testing post exposure to sexual partner with positive HIV diagnosis, pt requesting testing for other STDs, denies penile discharge, A&O x4

## 2016-07-12 NOTE — Discharge Instructions (Signed)
Return if any problems. You have lab results pending

## 2016-07-13 LAB — HIV ANTIBODY (ROUTINE TESTING W REFLEX): HIV Screen 4th Generation wRfx: NONREACTIVE

## 2016-07-13 LAB — RPR: RPR Ser Ql: NONREACTIVE

## 2016-07-14 LAB — GC/CHLAMYDIA PROBE AMP (~~LOC~~) NOT AT ARMC
Chlamydia: NEGATIVE
Neisseria Gonorrhea: NEGATIVE

## 2017-02-03 ENCOUNTER — Emergency Department (HOSPITAL_COMMUNITY)
Admission: EM | Admit: 2017-02-03 | Discharge: 2017-02-03 | Disposition: A | Discharge status: Home / Self Care | Payer: Self-pay | Attending: Emergency Medicine | Admitting: Emergency Medicine

## 2017-02-03 ENCOUNTER — Encounter (HOSPITAL_COMMUNITY): Payer: Self-pay | Admitting: Emergency Medicine

## 2017-02-03 DIAGNOSIS — S51812A Laceration without foreign body of left forearm, initial encounter: Secondary | ICD-10-CM | POA: Insufficient documentation

## 2017-02-03 DIAGNOSIS — J45909 Unspecified asthma, uncomplicated: Secondary | ICD-10-CM | POA: Insufficient documentation

## 2017-02-03 DIAGNOSIS — Y92007 Garden or yard of unspecified non-institutional (private) residence as the place of occurrence of the external cause: Secondary | ICD-10-CM | POA: Insufficient documentation

## 2017-02-03 DIAGNOSIS — F17210 Nicotine dependence, cigarettes, uncomplicated: Secondary | ICD-10-CM | POA: Insufficient documentation

## 2017-02-03 DIAGNOSIS — Z23 Encounter for immunization: Secondary | ICD-10-CM | POA: Insufficient documentation

## 2017-02-03 DIAGNOSIS — Y93H2 Activity, gardening and landscaping: Secondary | ICD-10-CM | POA: Insufficient documentation

## 2017-02-03 DIAGNOSIS — Y999 Unspecified external cause status: Secondary | ICD-10-CM | POA: Insufficient documentation

## 2017-02-03 DIAGNOSIS — W271XXA Contact with garden tool, initial encounter: Secondary | ICD-10-CM | POA: Insufficient documentation

## 2017-02-03 MED ORDER — LIDOCAINE-EPINEPHRINE 2 %-1:100000 IJ SOLN
20.0000 mL | Freq: Once | INTRAMUSCULAR | Status: AC
  Administered 2017-02-03: 20 mL
  Filled 2017-02-03: qty 20

## 2017-02-03 MED ORDER — IBUPROFEN 800 MG PO TABS
800.0000 mg | ORAL_TABLET | Freq: Once | ORAL | Status: AC
  Administered 2017-02-03: 800 mg via ORAL
  Filled 2017-02-03: qty 1

## 2017-02-03 MED ORDER — IBUPROFEN 600 MG PO TABS: 600.0000 mg | ORAL_TABLET | Freq: Four times a day (QID) | ORAL | 0 refills | Status: DC | PRN

## 2017-02-03 MED ORDER — TETANUS-DIPHTH-ACELL PERTUSSIS 5-2.5-18.5 LF-MCG/0.5 IM SUSP
0.5000 mL | Freq: Once | INTRAMUSCULAR | Status: AC
  Administered 2017-02-03: 0.5 mL via INTRAMUSCULAR
  Filled 2017-02-03: qty 0.5

## 2017-02-03 NOTE — ED Notes (Signed)
Pt verbalized understanding discharge instructions and denies any further needs or questions at this time. VS stable, ambulatory and steady gait.   

## 2017-02-03 NOTE — ED Provider Notes (Signed)
[facility] Provider Note   CSN: [id number] Arrival date & time: 02/03/17  1701     History   Chief Complaint Chief Complaint  Patient presents with  . Extremity Laceration    HPI Alejandro Elliott is a 26 y.o. male.  HPI   26 year old male presenting for evaluation of hand laceration.  Patient reported this morning around 10 AM he was outside doing yard work when he accidentally dropped a tool, he reached for the toe and actually cut himself on his left forearm without realizing it.  He noted some blood later and noticed the wound.  He did try to pull peroxide and wrapped it.  When he removed the wrap, it begins to bleed which prompted patient to come to the ER for evaluation.  Patient currently complaining of 10 out of 10 sharp nonradiating pain to the affected site.  He cannot recall last tetanus status.  He denies any associated numbness.  No specific treatment tried aside from wrapping and clean the wound with hydrogen peroxide.    Past Medical History:  Diagnosis Date  . Asthma   . Cyst near tailbone     There are no active problems to display for this patient.   Past Surgical History:  Procedure Laterality Date  . MANDIBLE FRACTURE SURGERY         Home Medications    Prior to Admission medications   Medication Sig Start Date End Date Taking? Authorizing Provider  albuterol (PROVENTIL HFA;VENTOLIN HFA) 108 (90 BASE) MCG/ACT inhaler Inhale 2 puffs into the lungs every 6 (six) hours as needed for wheezing or shortness of breath.     [provider]  HYDROcodone-acetaminophen (NORCO/VICODIN) 5-325 MG tablet Take 2 tablets by mouth every 4 (four) hours as needed. 02/17/15   Eber HongMiller, Brian, MD  ibuprofen (ADVIL,MOTRIN) 600 MG tablet Take 1 tablet (600 mg total) by mouth every 6 (six) hours as needed. 03/10/15   Kirichenko, Lemont Fillersatyana, PA-C  magic mouthwash w/lidocaine SOLN Take 5 mLs by mouth 4 (four) times daily as needed for  mouth pain. 02/12/15   Antony MaduraHumes, Kelly, PA-C  traMADol (ULTRAM) 50 MG tablet Take 1 tablet (50 mg total) by mouth every 6 (six) hours as needed. 03/10/15   Jaynie CrumbleKirichenko, Tatyana, PA-C    Family History Family History  Problem Relation Age of Onset  . Seizures Sister     Social History Social History   Tobacco Use  . Smoking status: Current Every Day Smoker    Types: Cigarettes  . Smokeless tobacco: Never Used  Substance Use Topics  . Alcohol use: Yes  . Drug use: Yes    Types: Cocaine, Marijuana     Allergies   Patient has no known allergies.   Review of Systems Review of Systems  Constitutional: Negative for fever.  Skin: Positive for wound.  Neurological: Negative for numbness.     Physical Exam Updated Vital Signs BP 124/73   Pulse 92   Temp 98.8 F (37.1 C) (Oral)   Resp 18   SpO2 99%   Physical Exam  Constitutional: He appears well-developed and well-nourished. No distress.  HENT:  Head: Atraumatic.  Eyes: Conjunctivae are normal.  Neck: Neck supple.  Neurological: He is alert.  Skin: No rash noted.  L forearm: 4cm horizontal superficial lac to the mid volar aspect of forearm without fb noted and not actively bleeding.  Sensation intact distally  Psychiatric: He has a normal mood and affect.  Nursing note and vitals reviewed.    ED Treatments / Results  Labs (all labs ordered are listed, but only abnormal results are displayed) Labs Reviewed - No data to display  EKG  EKG Interpretation None       Radiology No results found.  Procedures .Marland Kitchen.Laceration Repair Date/Time: 02/03/2017 7:13 PM Performed by: Fayrene Helperran, Jenesis Martin, PA-C Authorized by: Fayrene Helperran, Carline Dura, PA-C   Consent:    Consent obtained:  Verbal   Risks discussed:  Infection and poor wound healing   Alternatives discussed:  No treatment Anesthesia (see MAR for exact dosages):    Anesthesia method:  Local infiltration   Local anesthetic:  Lidocaine 2% WITH epi Laceration details:     Location:  Shoulder/arm   Shoulder/arm location:  L lower arm   Length (cm):  4   Depth (mm):  2 Repair type:    Repair type:  Simple Pre-procedure details:    Preparation:  Patient was prepped and draped in usual sterile fashion Exploration:    Hemostasis achieved with:  Direct pressure   Wound exploration: wound explored through full range of motion and entire depth of wound probed and visualized     Wound extent: no muscle damage noted and no nerve damage noted     Contaminated: no   Treatment:    Area cleansed with:  Saline   Amount of cleaning:  Standard   Irrigation solution:  Sterile saline   Irrigation volume:  200   Irrigation method:  Pressure wash   Visualized foreign bodies/material removed: no   Skin repair:    Repair method:  Sutures   Suture size:  5-0   Suture material:  Prolene   Suture technique:  Simple interrupted   Number of sutures:  6 Approximation:    Approximation:  Close   Vermilion border: well-aligned   Post-procedure details:    Dressing:  Adhesive bandage   Patient tolerance of procedure:  Tolerated well, no immediate complications   (including critical care time)  Medications Ordered in ED Medications - No data to display   Initial Impression / Assessment and Plan / ED Course  I have reviewed the triage vital signs and the nursing notes.  Pertinent labs & imaging results that were available during my care of the patient were reviewed by me and considered in my medical decision making (see chart for details).     BP 124/73   Pulse 92   Temp 98.8 F (37.1 C) (Oral)   Resp 18   SpO2 99%    Final Clinical Impressions(s) / ED Diagnoses   Final diagnoses:  Forearm laceration, left, initial encounter    ED Discharge Orders    None     7:18 PM Pt here with superficial lac to L forearm, repaired by PA student under my direct supervision . Tdap updated.  Wound care instruction given.  Sutures to be remove in 7 days.     Fayrene Helperran,  Petronella Shuford, PA-C 02/03/17 1919    Nira Connardama, Pedro Eduardo, MD 02/04/17 [phone]

## 2017-02-03 NOTE — ED Triage Notes (Signed)
Pt to ER, reports left forearm laceration that occurred this morning at 9 am while doing yard work. VSS. States "tried to take care of this at home, put peroxide on it and tried to wrap it but couldn't stop it from bleeding." pt unsure of last tetanus shot. A/o x4.

## 2017-02-03 NOTE — Discharge Instructions (Signed)
Please return in 7 days for sutures removal.  Return sooner if you notice signs of infection.

## 2017-05-03 ENCOUNTER — Other Ambulatory Visit: Payer: Self-pay

## 2017-05-03 ENCOUNTER — Emergency Department (HOSPITAL_BASED_OUTPATIENT_CLINIC_OR_DEPARTMENT_OTHER)
Admission: EM | Admit: 2017-05-03 | Discharge: 2017-05-03 | Disposition: A | Discharge status: Home / Self Care | Payer: Self-pay | Attending: Emergency Medicine | Admitting: Emergency Medicine

## 2017-05-03 ENCOUNTER — Encounter (HOSPITAL_BASED_OUTPATIENT_CLINIC_OR_DEPARTMENT_OTHER): Payer: Self-pay | Admitting: Emergency Medicine

## 2017-05-03 DIAGNOSIS — F17210 Nicotine dependence, cigarettes, uncomplicated: Secondary | ICD-10-CM | POA: Insufficient documentation

## 2017-05-03 DIAGNOSIS — J45909 Unspecified asthma, uncomplicated: Secondary | ICD-10-CM | POA: Insufficient documentation

## 2017-05-03 DIAGNOSIS — L02214 Cutaneous abscess of groin: Secondary | ICD-10-CM | POA: Insufficient documentation

## 2017-05-03 DIAGNOSIS — L0291 Cutaneous abscess, unspecified: Secondary | ICD-10-CM

## 2017-05-03 MED ORDER — IBUPROFEN 600 MG PO TABS: 600.0000 mg | ORAL_TABLET | Freq: Four times a day (QID) | ORAL | 0 refills | Status: DC | PRN

## 2017-05-03 MED ORDER — SULFAMETHOXAZOLE-TRIMETHOPRIM 800-160 MG PO TABS: 1.0000 | ORAL_TABLET | Freq: Two times a day (BID) | ORAL | 0 refills | Status: AC

## 2017-05-03 MED ORDER — LIDOCAINE HCL (PF) 1 % IJ SOLN
10.0000 mL | Freq: Once | INTRAMUSCULAR | Status: AC
  Administered 2017-05-03: 10 mL
  Filled 2017-05-03: qty 10

## 2017-05-03 MED ORDER — PENTAFLUOROPROP-TETRAFLUOROETH EX AERO
INHALATION_SPRAY | CUTANEOUS | Status: AC
  Filled 2017-05-03: qty 30

## 2017-05-03 MED ORDER — IBUPROFEN 800 MG PO TABS
800.0000 mg | ORAL_TABLET | Freq: Once | ORAL | Status: AC
  Administered 2017-05-03: 800 mg via ORAL
  Filled 2017-05-03: qty 1

## 2017-05-03 NOTE — Discharge Instructions (Signed)
As discussed, have the wound rechecked in 2 days.  Continue with warm soaks and flushes afterwards.  Ibuprofen as needed for pain..  Take your entire course of antibiotics even if you feel better.  Return sooner if swelling increases, worsening pain, redness, purulence, fever, chills or other new concerning symptoms in the meantime.

## 2017-05-03 NOTE — ED Notes (Addendum)
Alert, NAD, calm, interactive, resps e/u, speaking in clear complete sentences, no dyspnea noted, skin W&D, VSS, c/o R inguinal/groin pain/ abscess, swelling and hardness present, TTP, no obvious redness or induration, scrotum and testicles appear unremarkable, (denies: abd or back pain, testicular pain, sob, fever, NVD, bleeding, penile d/c, h/o hernias, change in bowel or bladder, urinary sx, or dizziness).

## 2017-05-03 NOTE — ED Provider Notes (Signed)
[facility] EMERGENCY DEPARTMENT Provider Note   CSN: [id number] Arrival date & time: 05/03/17  1516     History   Chief Complaint Chief Complaint  Patient presents with  . Abscess    HPI Alejandro Elliott is a 27 y.o. male with history of pilonidal cyst presenting with right groin abscess onset less than 24 hours ago.  He denies any fever, chills, nausea vomiting or other symptoms. He has tried warm compresses without relief. No testicular swelling or abdominal bulging.  No abdominal pain.   HPI  Past Medical History:  Diagnosis Date  . Asthma   . Cyst near tailbone     There are no active problems to display for this patient.   Past Surgical History:  Procedure Laterality Date  . MANDIBLE FRACTURE SURGERY         Home Medications    Prior to Admission medications   Medication Sig Start Date End Date Taking? Authorizing Provider  albuterol (PROVENTIL HFA;VENTOLIN HFA) 108 (90 BASE) MCG/ACT inhaler Inhale 2 puffs into the lungs every 6 (six) hours as needed for wheezing or shortness of breath.     [provider]  HYDROcodone-acetaminophen (NORCO/VICODIN) 5-325 MG tablet Take 2 tablets by mouth every 4 (four) hours as needed. 02/17/15   Eber HongMiller, Brian, MD  ibuprofen (ADVIL,MOTRIN) 600 MG tablet Take 1 tablet (600 mg total) by mouth every 6 (six) hours as needed. 05/03/17   Mathews RobinsonsMitchell, Deshundra Waller B, PA-C  magic mouthwash w/lidocaine SOLN Take 5 mLs by mouth 4 (four) times daily as needed for mouth pain. 02/12/15   Antony MaduraHumes, Kelly, PA-C  sulfamethoxazole-trimethoprim (BACTRIM DS,SEPTRA DS) 800-160 MG tablet Take 1 tablet by mouth 2 (two) times daily for 7 days. 05/03/17 05/10/17  Mathews RobinsonsMitchell, Pooja Camuso B, PA-C  traMADol (ULTRAM) 50 MG tablet Take 1 tablet (50 mg total) by mouth every 6 (six) hours as needed. 03/10/15   Jaynie CrumbleKirichenko, Tatyana, PA-C    Family History Family History  Problem Relation Age of Onset  . Seizures Sister     Social History Social History    Tobacco Use  . Smoking status: Current Every Day Smoker    Types: Cigarettes  . Smokeless tobacco: Never Used  Substance Use Topics  . Alcohol use: Yes  . Drug use: Yes    Types: Cocaine, Marijuana     Allergies   Patient has no known allergies.   Review of Systems Review of Systems  Constitutional: Negative for chills and fever.  Cardiovascular: Negative for leg swelling.  Gastrointestinal: Negative for abdominal pain, nausea and vomiting.  Genitourinary: Negative for decreased urine volume, difficulty urinating, discharge, dysuria, frequency, hematuria, penile pain, penile swelling, scrotal swelling and testicular pain.  Musculoskeletal: Negative for myalgias.  Skin: Positive for color change.       Induration and pain right inguinal region  Neurological: Negative for weakness and numbness.     Physical Exam Updated Vital Signs BP 140/75 (BP Location: Right Arm)   Pulse 72   Temp 98.7 F (37.1 C) (Oral)   Resp 18   Ht 6' (1.829 m)   Wt 97.5 kg (215 lb)   SpO2 100%   BMI 29.16 kg/m   Physical Exam  Constitutional: He appears well-developed and well-nourished. No distress.  Well-appearing, afebrile nontoxic sitting comfortably in chair in no acute distress.  HENT:  Head: Normocephalic and atraumatic.  Eyes: Conjunctivae are normal.  Neck: Neck supple.  Cardiovascular: Normal rate.  Pulmonary/Chest: Effort normal. No respiratory distress.  Abdominal:  Soft. He exhibits no distension. There is no tenderness.  Genitourinary:  Genitourinary Comments: Approximately 2 cm area of induration with central fluctuance to the right inguinal region. Very ttp focally.  No bulging or testicular swelling concerning for hernia.  Musculoskeletal: He exhibits no edema.  Neurological: He is alert.  Skin: Skin is warm and dry. He is not diaphoretic.  Psychiatric: He has a normal mood and affect.  Nursing note and vitals reviewed.    ED Treatments / Results  Labs (all labs  ordered are listed, but only abnormal results are displayed) Labs Reviewed - No data to display  EKG  EKG Interpretation None       Radiology No results found.  Procedures Procedures (including critical care time)  EMERGENCY DEPARTMENT US SOFT TISSUE INTERPRETATION "Study: Limited Soft Tissue Ultrasound"  INDICATIONS: Soft tissue infection Multiple views of the body part were obtained in real-time with a multi-frequency linear probe  PERFORMED BY: Other  IMAGES ARCHIVED?: Yes SIDE:Right  BODY PART:Lower extremity INTERPRETATION:  Abcess present   INCISION AND DRAINAGE Performed by: Georgiana Shore Consent: Verbal consent obtained. Risks and benefits: risks, benefits and alternatives were discussed Type: abscess  Body area: right groin  Anesthesia: local infiltration  Incision was made with a scalpel.  Local anesthetic: lidocaine 1% without epinephrine  Anesthetic total: 3 ml  Complexity: complex Blunt dissection to break up loculations  Drainage: purulent  Drainage amount: 4mL  Packing material: 1/4 in iodoform gauze  Patient tolerance: Patient tolerated the procedure well with no immediate complications.    Medications Ordered in ED Medications  pentafluoroprop-tetrafluoroeth (GEBAUERS) aerosol (not administered)  ibuprofen (ADVIL,MOTRIN) tablet 800 mg (800 mg Oral Given 05/03/17 2110)  lidocaine (PF) (XYLOCAINE) 1 % injection 10 mL (10 mLs Infiltration Given 05/03/17 2114)     Initial Impression / Assessment and Plan / ED Course  I have reviewed the triage vital signs and the nursing notes.  Pertinent labs & imaging results that were available during my care of the patient were reviewed by me and considered in my medical decision making (see chart for details).    Patient with skin abscess amenable to incision and drainage.  Abscess was large enough to warrant packing or drain,  wound recheck in 2 days. Encouraged home warm soaks and  flushing.  Mild signs of cellulitis is surrounding skin.    Discharge home with symptomatic relief and antibiotics with follow-up in 2 days for recheck.  Discussed strict return precautions and advised to return to the emergency department if experiencing any new or worsening symptoms. Instructions were understood and patient agreed with discharge plan.  Final Clinical Impressions(s) / ED Diagnoses   Final diagnoses:  Abscess    ED Discharge Orders        Ordered    ibuprofen (ADVIL,MOTRIN) 600 MG tablet  Every 6 hours PRN     05/03/17 2243    sulfamethoxazole-trimethoprim (BACTRIM DS,SEPTRA DS) 800-160 MG tablet  2 times daily     05/03/17 2243       Georgiana Shore, PA-C 05/04/17 Julianne Rice, MD 05/04/17 [phone]

## 2017-05-03 NOTE — ED Triage Notes (Signed)
Patient states that he has a groin abscess x 2 - 4 days

## 2017-05-03 NOTE — ED Notes (Signed)
EDPA into room, prior to RN assessment, see PA notes, pending orders. Preparing for I&D of abscess.

## 2017-05-03 NOTE — ED Notes (Signed)
Pt given d/c instructions as per chart. Rx x 2. Verbalizes understanding. No questions. 

## 2017-05-06 ENCOUNTER — Emergency Department (HOSPITAL_COMMUNITY)
Admission: EM | Admit: 2017-05-06 | Discharge: 2017-05-06 | Disposition: A | Discharge status: Home / Self Care | Payer: Self-pay | Attending: Emergency Medicine | Admitting: Emergency Medicine

## 2017-05-06 ENCOUNTER — Encounter (HOSPITAL_COMMUNITY): Payer: Self-pay

## 2017-05-06 ENCOUNTER — Other Ambulatory Visit: Payer: Self-pay

## 2017-05-06 DIAGNOSIS — L02214 Cutaneous abscess of groin: Secondary | ICD-10-CM | POA: Insufficient documentation

## 2017-05-06 DIAGNOSIS — Z5189 Encounter for other specified aftercare: Secondary | ICD-10-CM

## 2017-05-06 NOTE — ED Triage Notes (Signed)
Pt states he was told to get f/u for abscess packing; Pt states packing came out; Pt states pain at 3/10 on arrival.-Monique,RN

## 2017-05-06 NOTE — Discharge Instructions (Signed)
Warm soapy baths several times a day. Take antibiotics. Tylenol/motrin for pain. Follow up as needed.

## 2017-05-06 NOTE — ED Provider Notes (Signed)
[facility]   CSN: [id number] Arrival date & time: 05/06/17  [id number]     History   Chief Complaint Chief Complaint  Patient presents with  . Recurrent Skin Infections    HPI Alejandro Elliott is a 27 y.o. male.  HPI Alejandro Elliott is a 27 y.o. male presents to ED with complaint of an abscess recheck. Pt states he had an abscess drained on his right groin/scrotum 3 days ago. States packing came out yesterday. States he did not get antibiotics filled bc he had no money.  Denies any fever or chills.  Denies any nausea or vomiting.  He states pain has improved, only worsened with palpation and walking.  Denies any drainage.  Denies any other symptoms at this time.   Past Medical History:  Diagnosis Date  . Asthma   . Cyst near tailbone     There are no active problems to display for this patient.   Past Surgical History:  Procedure Laterality Date  . MANDIBLE FRACTURE SURGERY         Home Medications    Prior to Admission medications   Medication Sig Start Date End Date Taking? Authorizing Provider  albuterol (PROVENTIL HFA;VENTOLIN HFA) 108 (90 BASE) MCG/ACT inhaler Inhale 2 puffs into the lungs every 6 (six) hours as needed for wheezing or shortness of breath.     [provider]  HYDROcodone-acetaminophen (NORCO/VICODIN) 5-325 MG tablet Take 2 tablets by mouth every 4 (four) hours as needed. 02/17/15   Eber HongMiller, Brian, MD  ibuprofen (ADVIL,MOTRIN) 600 MG tablet Take 1 tablet (600 mg total) by mouth every 6 (six) hours as needed. 05/03/17   Mathews RobinsonsMitchell, Jessica B, PA-C  magic mouthwash w/lidocaine SOLN Take 5 mLs by mouth 4 (four) times daily as needed for mouth pain. 02/12/15   Antony MaduraHumes, Kelly, PA-C  sulfamethoxazole-trimethoprim (BACTRIM DS,SEPTRA DS) 800-160 MG tablet Take 1 tablet by mouth 2 (two) times daily for 7 days. 05/03/17 05/10/17  Mathews RobinsonsMitchell, Jessica B, PA-C  traMADol (ULTRAM) 50 MG tablet Take 1 tablet (50 mg total)  by mouth every 6 (six) hours as needed. 03/10/15   Jaynie CrumbleKirichenko, Sana Tessmer, PA-C    Family History Family History  Problem Relation Age of Onset  . Seizures Sister     Social History Social History   Tobacco Use  . Smoking status: Current Every Day Smoker    Types: Cigarettes  . Smokeless tobacco: Never Used  Substance Use Topics  . Alcohol use: Yes  . Drug use: Yes    Types: Cocaine, Marijuana     Allergies   Patient has no known allergies.   Review of Systems Review of Systems  Constitutional: Negative for chills and fever.  Respiratory: Negative for cough, chest tightness and shortness of breath.   Cardiovascular: Negative for chest pain, palpitations and leg swelling.  Musculoskeletal: Negative for arthralgias, myalgias, neck pain and neck stiffness.  Skin: Positive for wound.  Allergic/Immunologic: Negative for immunocompromised state.  Neurological: Negative for dizziness, weakness, light-headedness, numbness and headaches.  All other systems reviewed and are negative.    Physical Exam Updated Vital Signs BP 124/71 (BP Location: Right Arm)   Pulse 81   Temp 98.5 F (36.9 C) (Oral)   Resp 18   SpO2 99%   Physical Exam  Constitutional: He appears well-developed and well-nourished. No distress.  Genitourinary:  Genitourinary Comments: 1cm incision to the right groin, just lateral to the scrotum. No drainage. Minimal induration around the  incision. No erythema to the area. No swelling. Minimal ttp.   Skin: Skin is warm and dry.     ED Treatments / Results  Labs (all labs ordered are listed, but only abnormal results are displayed) Labs Reviewed - No data to display  EKG  EKG Interpretation None       Radiology No results found.  Procedures Procedures (including critical care time)  Medications Ordered in ED Medications - No data to display   Initial Impression / Assessment and Plan / ED Course  I have reviewed the triage vital signs and  the nursing notes.  Pertinent labs & imaging results that were available during my care of the patient were reviewed by me and considered in my medical decision making (see chart for details).     Patient with an abscess to the right groin, drained 3 days ago.  Packing fell out on its own.  There is minimal induration around the abscess, no erythema, no swelling healing well.  Encouraged to take antibiotics and warm soapy baths several times a day for wound care.  Follow-up as needed.  Patient agreed.  Patient is afebrile, nontoxic-appearing, stable for discharge  Vitals:   05/06/17 0543  BP: 124/71  Pulse: 81  Resp: 18  Temp: 98.5 F (36.9 C)  TempSrc: Oral  SpO2: 99%     Final Clinical Impressions(s) / ED Diagnoses   Final diagnoses:  None    ED Discharge Orders    None       Jaynie Crumble, PA-C 05/06/17 1308    Palumbo, April, MD 05/06/17 2333

## 2017-05-13 ENCOUNTER — Emergency Department (HOSPITAL_COMMUNITY)
Admission: EM | Admit: 2017-05-13 | Discharge: 2017-05-14 | Disposition: A | Discharge status: Home / Self Care | Payer: Self-pay | Attending: Emergency Medicine | Admitting: Emergency Medicine

## 2017-05-13 ENCOUNTER — Encounter (HOSPITAL_COMMUNITY): Payer: Self-pay | Admitting: Emergency Medicine

## 2017-05-13 ENCOUNTER — Other Ambulatory Visit: Payer: Self-pay

## 2017-05-13 ENCOUNTER — Emergency Department (HOSPITAL_COMMUNITY): Payer: Self-pay

## 2017-05-13 DIAGNOSIS — F17210 Nicotine dependence, cigarettes, uncomplicated: Secondary | ICD-10-CM | POA: Insufficient documentation

## 2017-05-13 DIAGNOSIS — M940 Chondrocostal junction syndrome [Tietze]: Secondary | ICD-10-CM | POA: Insufficient documentation

## 2017-05-13 DIAGNOSIS — J45909 Unspecified asthma, uncomplicated: Secondary | ICD-10-CM | POA: Insufficient documentation

## 2017-05-13 LAB — BASIC METABOLIC PANEL
Anion gap: 12 (ref 5–15)
BUN: 11 mg/dL (ref 6–20)
CALCIUM: 9.1 mg/dL (ref 8.9–10.3)
CO2: 24 mmol/L (ref 22–32)
CREATININE: 1.46 mg/dL — AB (ref 0.61–1.24)
Chloride: 104 mmol/L (ref 101–111)
GFR calc Af Amer: >60 mL/min (ref >60)
GFR calc non Af Amer: >60 mL/min (ref >60)
GLUCOSE: 86 mg/dL (ref 65–99)
Potassium: 4.5 mmol/L (ref 3.5–5.1)
Sodium: 140 mmol/L (ref 135–145)

## 2017-05-13 LAB — CBC
HCT: 42.2 % (ref 39.0–52.0)
Hemoglobin: 13.7 g/dL (ref 13.0–17.0)
MCH: 27 pg (ref 26.0–34.0)
MCHC: 32.5 g/dL (ref 30.0–36.0)
MCV: 83.2 fL (ref 78.0–100.0)
PLATELETS: 317 10*3/uL (ref 150–400)
RBC: 5.07 MIL/uL (ref 4.22–5.81)
RDW: 15 % (ref 11.5–15.5)
WBC: 6.3 10*3/uL (ref 4.0–10.5)

## 2017-05-13 LAB — TROPONIN I

## 2017-05-13 NOTE — ED Triage Notes (Signed)
Pt states he is been sick for the past 2 weeks not been able to eat or drink, he feels very weak and today he is having mid cp 8/10. No fever or chills.

## 2017-05-13 NOTE — ED Notes (Signed)
Called to add on troponin

## 2017-05-14 MED ORDER — NAPROXEN 500 MG PO TABS: 500.0000 mg | ORAL_TABLET | Freq: Two times a day (BID) | ORAL | 0 refills | Status: DC

## 2017-05-14 MED ORDER — BENZONATATE 100 MG PO CAPS: 100.0000 mg | ORAL_CAPSULE | Freq: Three times a day (TID) | ORAL | 0 refills | Status: DC | PRN

## 2017-05-14 NOTE — ED Notes (Signed)
Patient verbalizes understanding of discharge instructions. Opportunity for questioning and answers were provided. Armband removed by staff, pt discharged from ED.  

## 2017-05-14 NOTE — ED Provider Notes (Signed)
[facility]   CSN: [id number] Arrival date & time: 05/13/17  2045     History   Chief Complaint Chief Complaint  Patient presents with  . Chest Pain    HPI Alejandro Elliott is a 27 y.o. male.  27 year old male presents to the emergency department for complaints of chest pain.  He reports flulike symptoms 2 weeks ago with associated cough, body aches.  He states that his cough has persisted and is productive of clear/pale yellow phlegm.  He notes chest pain intermittently, specifically when coughing.  Patient rates pain at 8/10, describes it as sharp.  No other complaints of chest pain at rest.  He previously tried over-the-counter medication for flulike symptom management.  He has not continue to take any additional medication for his chest pain.  He denies any recent fevers, hemoptysis.  No complaints of shortness of breath.   The history is provided by the patient. No language interpreter was used.  Chest Pain      Past Medical History:  Diagnosis Date  . Asthma   . Cyst near tailbone     There are no active problems to display for this patient.   Past Surgical History:  Procedure Laterality Date  . MANDIBLE FRACTURE SURGERY         Home Medications    Prior to Admission medications   Medication Sig Start Date End Date Taking? Authorizing Provider  albuterol (PROVENTIL HFA;VENTOLIN HFA) 108 (90 BASE) MCG/ACT inhaler Inhale 2 puffs into the lungs every 6 (six) hours as needed for wheezing or shortness of breath.     [provider]  benzonatate (TESSALON) 100 MG capsule Take 1 capsule (100 mg total) by mouth 3 (three) times daily as needed for cough. 05/14/17   Antony MaduraHumes, Kaarin Pardy, PA-C  HYDROcodone-acetaminophen (NORCO/VICODIN) 5-325 MG tablet Take 2 tablets by mouth every 4 (four) hours as needed. 02/17/15   Eber HongMiller, Brian, MD  magic mouthwash w/lidocaine SOLN Take 5 mLs by mouth 4 (four) times daily as needed for  mouth pain. 02/12/15   Antony MaduraHumes, Marcene Laskowski, PA-C  naproxen (NAPROSYN) 500 MG tablet Take 1 tablet (500 mg total) by mouth 2 (two) times daily. 05/14/17   Antony MaduraHumes, Hilary Milks, PA-C  traMADol (ULTRAM) 50 MG tablet Take 1 tablet (50 mg total) by mouth every 6 (six) hours as needed. 03/10/15   Jaynie CrumbleKirichenko, Tatyana, PA-C    Family History Family History  Problem Relation Age of Onset  . Seizures Sister     Social History Social History   Tobacco Use  . Smoking status: Current Every Day Smoker    Types: Cigarettes  . Smokeless tobacco: Never Used  Substance Use Topics  . Alcohol use: Yes  . Drug use: Yes    Types: Cocaine, Marijuana     Allergies   Patient has no known allergies.   Review of Systems Review of Systems  Cardiovascular: Positive for chest pain.  Ten systems reviewed and are negative for acute change, except as noted in the HPI.    Physical Exam Updated Vital Signs BP (!) 107/91   Pulse 90   Temp 98.4 F (36.9 C) (Oral)   Resp 18   Ht 6' (1.829 m)   Wt 97.5 kg (215 lb)   SpO2 100%   BMI 29.16 kg/m   Physical Exam  Constitutional: He is oriented to person, place, and time. He appears well-developed and well-nourished. No distress.  Nontoxic appearing and in NAD  HENT:  Head: Normocephalic and atraumatic.  Eyes: Conjunctivae and EOM are normal. No scleral icterus.  Neck: Normal range of motion.  Cardiovascular: Normal rate, regular rhythm and intact distal pulses.  Pulmonary/Chest: Effort normal. No stridor. No respiratory distress. He has no wheezes. He has no rales.  Lungs CTAB  Musculoskeletal: Normal range of motion.  Neurological: He is alert and oriented to person, place, and time. He exhibits normal muscle tone. Coordination normal.  GCS 15. Moving all extremities spontaneously.  Skin: Skin is warm and dry. No rash noted. He is not diaphoretic. No erythema. No pallor.  Psychiatric: He has a normal mood and affect. His behavior is normal.  Nursing note and  vitals reviewed.    ED Treatments / Results  Labs (all labs ordered are listed, but only abnormal results are displayed) Labs Reviewed  BASIC METABOLIC PANEL - Abnormal; Notable for the following components:      Result Value   Creatinine, Ser 1.46 (*)    All other components within normal limits  CBC  TROPONIN I    EKG  EKG Interpretation  Date/Time:  Wednesday May 13 2017 20:53:55 EST Ventricular Rate:  95 PR Interval:  122 QRS Duration: 84 QT Interval:  358 QTC Calculation: 449 R Axis:   80 Text Interpretation:  ** Poor data quality, interpretation may be adversely affected Normal sinus rhythm Minimal voltage criteria for LVH, may be normal variant Borderline ECG When compared with ECG of 12/11/2013, n Confirmed by Dione Booze ([id number]) on 05/13/2017 10:58:34 PM       Radiology Dg Chest 2 View  Result Date: 05/13/2017 CLINICAL DATA:  Chest pain EXAM: CHEST  2 VIEW COMPARISON:  September 30, 2014 FINDINGS: Lungs are clear. Heart size and pulmonary vascularity are normal. No adenopathy. No pneumothorax. No bone lesions. IMPRESSION: No edema or consolidation. Electronically Signed   By: Bretta Bang III M.D.   On: 05/13/2017 21:12    Procedures Procedures (including critical care time)  Medications Ordered in ED Medications - No data to display   Initial Impression / Assessment and Plan / ED Course  I have reviewed the triage vital signs and the nursing notes.  Pertinent labs & imaging results that were available during my care of the patient were reviewed by me and considered in my medical decision making (see chart for details).     27 year old male presents for symptoms of chest pain in the setting of recent flulike illness.  Symptoms consistent with costochondritis as they are aggravated with coughing.  Patient with reassuring cardiac workup today as well as stable vital signs.  Her troponin is negative today and EKG is without signs of acute ischemia.  He is  afebrile.  No concerns for pneumonia on chest x-ray.  Plan to continue with supportive management including NSAIDs and cough suppressant.  Patient advised to follow-up with his primary care doctor.  Return precautions discussed and provided. Patient discharged in stable condition with no unaddressed concerns.   Final Clinical Impressions(s) / ED Diagnoses   Final diagnoses:  Costochondritis    ED Discharge Orders        Ordered    naproxen (NAPROSYN) 500 MG tablet  2 times daily     05/14/17 0030    benzonatate (TESSALON) 100 MG capsule  3 times daily PRN     05/14/17 0030       Antony Madura, PA-C 05/14/17 4098    Pricilla Loveless, MD 05/14/17 0145

## 2018-05-20 ENCOUNTER — Emergency Department (HOSPITAL_COMMUNITY): Payer: Self-pay

## 2018-05-20 ENCOUNTER — Encounter (HOSPITAL_COMMUNITY): Payer: Self-pay | Admitting: Emergency Medicine

## 2018-05-20 ENCOUNTER — Emergency Department (HOSPITAL_COMMUNITY)
Admission: EM | Admit: 2018-05-20 | Discharge: 2018-05-20 | Disposition: A | Discharge status: Home / Self Care | Payer: Self-pay | Attending: Emergency Medicine | Admitting: Emergency Medicine

## 2018-05-20 ENCOUNTER — Other Ambulatory Visit: Payer: Self-pay

## 2018-05-20 DIAGNOSIS — J45909 Unspecified asthma, uncomplicated: Secondary | ICD-10-CM | POA: Insufficient documentation

## 2018-05-20 DIAGNOSIS — S0083XA Contusion of other part of head, initial encounter: Secondary | ICD-10-CM

## 2018-05-20 DIAGNOSIS — R6884 Jaw pain: Secondary | ICD-10-CM | POA: Insufficient documentation

## 2018-05-20 DIAGNOSIS — F17210 Nicotine dependence, cigarettes, uncomplicated: Secondary | ICD-10-CM | POA: Insufficient documentation

## 2018-05-20 DIAGNOSIS — Z79899 Other long term (current) drug therapy: Secondary | ICD-10-CM | POA: Insufficient documentation
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
## 2018-05-20 NOTE — ED Notes (Signed)
Patient transported to CT 
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
## 2018-05-20 NOTE — Discharge Instructions (Signed)
You can take Tylenol or Ibuprofen as directed for pain. You can alternate Tylenol and Ibuprofen every 4 hours. If you take Tylenol at 1pm, then you can take Ibuprofen at 5pm. Then you can take Tylenol again at 9pm.   Apply ice to help with pain and swelling.  Follow-up with [facility] to establish a primary care doctor if you do not have one.   Emergency department any worsening pain, difficulty swallowing or secretions, difficulty breathing, vomiting or any other worsening or concerning symptoms.

## 2018-05-20 NOTE — ED Notes (Signed)
Patient verbalizes understanding of discharge instructions. Opportunity for questioning and answers were provided. Armband removed by staff, pt discharged from ED. Pt ambulatory to lobby. Follow up care reviewed 

## 2018-06-07 ENCOUNTER — Emergency Department (HOSPITAL_COMMUNITY)
Admission: EM | Admit: 2018-06-07 | Discharge: 2018-06-07 | Disposition: A | Discharge status: Home / Self Care | Payer: Self-pay | Attending: Emergency Medicine | Admitting: Emergency Medicine

## 2018-06-07 ENCOUNTER — Emergency Department (HOSPITAL_COMMUNITY): Payer: Self-pay

## 2018-06-07 DIAGNOSIS — R569 Unspecified convulsions: Secondary | ICD-10-CM | POA: Insufficient documentation

## 2018-06-07 DIAGNOSIS — F17210 Nicotine dependence, cigarettes, uncomplicated: Secondary | ICD-10-CM | POA: Insufficient documentation

## 2018-06-07 DIAGNOSIS — J45909 Unspecified asthma, uncomplicated: Secondary | ICD-10-CM | POA: Insufficient documentation

## 2018-06-07 MED ORDER — LEVETIRACETAM 500 MG PO TABS
1000.0000 mg | ORAL_TABLET | Freq: Once | ORAL | Status: AC
  Administered 2018-06-07: 1000 mg via ORAL
  Filled 2018-06-07: qty 2

## 2018-06-07 MED ORDER — LEVETIRACETAM 500 MG PO TABS: 500.0000 mg | ORAL_TABLET | Freq: Two times a day (BID) | ORAL | 0 refills | Status: DC
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
## 2018-06-07 NOTE — Discharge Instructions (Signed)
It is important that you make an appointment with neurology (referral provided above) for further evaluation and management of recurrent seizures. Take Keppra twice daily. DO NOT DRIVE OR OPERATE HEAVY MACHINERY UNTIL CLEARED BY YOUR NEUROLOGIST.
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
## 2018-06-07 NOTE — ED Notes (Signed)
Bed: RESB Expected date:  Expected time:  Means of arrival:  Comments: EMS: Seizure

## 2018-06-07 NOTE — ED Notes (Signed)
Pt tolerating sandwich and juice with no difficulty.

## 2018-07-17 ENCOUNTER — Observation Stay (HOSPITAL_COMMUNITY)
Admission: EM | Admit: 2018-07-17 | Discharge: 2018-07-18 | Disposition: A | Discharge status: Home / Self Care | Payer: Self-pay | Attending: Surgery | Admitting: Surgery

## 2018-07-17 ENCOUNTER — Observation Stay (HOSPITAL_COMMUNITY): Payer: Self-pay | Admitting: Certified Registered"

## 2018-07-17 ENCOUNTER — Other Ambulatory Visit: Payer: Self-pay

## 2018-07-17 ENCOUNTER — Emergency Department (HOSPITAL_COMMUNITY): Payer: Self-pay

## 2018-07-17 ENCOUNTER — Encounter (HOSPITAL_COMMUNITY)
Admission: EM | Disposition: A | Discharge status: Home / Self Care | Payer: Self-pay | Source: Home / Self Care | Attending: Emergency Medicine

## 2018-07-17 ENCOUNTER — Encounter (HOSPITAL_COMMUNITY): Payer: Self-pay | Admitting: Emergency Medicine

## 2018-07-17 DIAGNOSIS — Z23 Encounter for immunization: Secondary | ICD-10-CM | POA: Insufficient documentation

## 2018-07-17 DIAGNOSIS — S1191XA Laceration without foreign body of unspecified part of neck, initial encounter: Secondary | ICD-10-CM

## 2018-07-17 DIAGNOSIS — S162XXA Laceration of muscle, fascia and tendon at neck level, initial encounter: Principal | ICD-10-CM | POA: Insufficient documentation

## 2018-07-17 DIAGNOSIS — S66902A Unspecified injury of unspecified muscle, fascia and tendon at wrist and hand level, left hand, initial encounter: Secondary | ICD-10-CM

## 2018-07-17 DIAGNOSIS — S61402A Unspecified open wound of left hand, initial encounter: Secondary | ICD-10-CM

## 2018-07-17 DIAGNOSIS — S6490XA Injury of unspecified nerve at wrist and hand level of unspecified arm, initial encounter: Secondary | ICD-10-CM

## 2018-07-17 DIAGNOSIS — S52302B Unspecified fracture of shaft of left radius, initial encounter for open fracture type I or II: Secondary | ICD-10-CM | POA: Insufficient documentation

## 2018-07-17 DIAGNOSIS — S41112A Laceration without foreign body of left upper arm, initial encounter: Secondary | ICD-10-CM

## 2018-07-17 DIAGNOSIS — S56522A Laceration of other extensor muscle, fascia and tendon at forearm level, left arm, initial encounter: Secondary | ICD-10-CM | POA: Diagnosis present

## 2018-07-17 HISTORY — PX: LACERATION REPAIR: SHX5284

## 2018-07-17 HISTORY — PX: INCISION AND DRAINAGE OF WOUND: SHX1803

## 2018-07-17 LAB — COMPREHENSIVE METABOLIC PANEL
ALT: 16 U/L (ref 0–44)
AST: 24 U/L (ref 15–41)
Albumin: 3.8 g/dL (ref 3.5–5.0)
Alkaline Phosphatase: 55 U/L (ref 38–126)
Anion gap: 11 (ref 5–15)
BUN: 8 mg/dL (ref 6–20)
CO2: 24 mmol/L (ref 22–32)
Calcium: 8.8 mg/dL — ABNORMAL LOW (ref 8.9–10.3)
Chloride: 103 mmol/L (ref 98–111)
Creatinine, Ser: 1.27 mg/dL — ABNORMAL HIGH (ref 0.61–1.24)
GFR calc Af Amer: >60 mL/min (ref >60)
GFR calc non Af Amer: >60 mL/min (ref >60)
Glucose, Bld: 118 mg/dL — ABNORMAL HIGH (ref 70–99)
Potassium: 3.7 mmol/L (ref 3.5–5.1)
Sodium: 138 mmol/L (ref 135–145)
Total Bilirubin: 0.3 mg/dL (ref 0.3–1.2)
Total Protein: 6.1 g/dL — ABNORMAL LOW (ref 6.5–8.1)

## 2018-07-17 LAB — PREPARE FRESH FROZEN PLASMA: Unit division: 0

## 2018-07-17 LAB — SURGICAL PCR SCREEN
MRSA, PCR: NEGATIVE
Staphylococcus aureus: NEGATIVE

## 2018-07-17 LAB — CBC
HCT: 43.2 % (ref 39.0–52.0)
Hemoglobin: 13.8 g/dL (ref 13.0–17.0)
MCH: 26.9 pg (ref 26.0–34.0)
MCHC: 31.9 g/dL (ref 30.0–36.0)
MCV: 84.2 fL (ref 80.0–100.0)
Platelets: 252 10*3/uL (ref 150–400)
RBC: 5.13 MIL/uL (ref 4.22–5.81)
RDW: 15.2 % (ref 11.5–15.5)
WBC: 6.7 10*3/uL (ref 4.0–10.5)
nRBC: 0 % (ref 0.0–0.2)

## 2018-07-17 LAB — BPAM RBC
Blood Product Expiration Date: 202005162359
Blood Product Expiration Date: 202005162359
ISSUE DATE / TIME: 202005020322
ISSUE DATE / TIME: 202005020322
Unit Type and Rh: 5100
Unit Type and Rh: 5100

## 2018-07-17 LAB — TYPE AND SCREEN
ABO/RH(D): O POS
Antibody Screen: NEGATIVE
Unit division: 0
Unit division: 0

## 2018-07-17 LAB — LACTIC ACID, PLASMA: Lactic Acid, Venous: 3 mmol/L (ref 0.5–1.9)

## 2018-07-17 LAB — BPAM FFP
Blood Product Expiration Date: 202005052359
Blood Product Expiration Date: 202005052359
ISSUE DATE / TIME: 202005020322
ISSUE DATE / TIME: 202005020322
Unit Type and Rh: 6200
Unit Type and Rh: 6200

## 2018-07-17 LAB — ETHANOL: Alcohol, Ethyl (B): <10 mg/dL (ref <10)

## 2018-07-17 LAB — CDS SEROLOGY

## 2018-07-17 LAB — HIV ANTIBODY (ROUTINE TESTING W REFLEX): HIV Screen 4th Generation wRfx: NONREACTIVE

## 2018-07-17 LAB — PROTIME-INR
INR: 1 (ref 0.8–1.2)
Prothrombin Time: 12.8 seconds (ref 11.4–15.2)

## 2018-07-17 LAB — ABO/RH: ABO/RH(D): O POS

## 2018-07-17 SURGERY — REPAIR, LACERATION, 2 OR MORE
Anesthesia: General | Site: Neck | Laterality: Left

## 2018-07-17 MED ORDER — PROMETHAZINE HCL 25 MG/ML IJ SOLN: 6.2500 mg | INTRAMUSCULAR | Status: DC | PRN

## 2018-07-17 MED ORDER — PROPOFOL 10 MG/ML IV BOLUS
INTRAVENOUS | Status: DC | PRN
  Administered 2018-07-17: 190 mg via INTRAVENOUS

## 2018-07-17 MED ORDER — KETAMINE HCL 10 MG/ML IJ SOLN
INTRAMUSCULAR | Status: DC | PRN
  Administered 2018-07-17: 15 mg via INTRAVENOUS
  Administered 2018-07-17: 10 mg via INTRAVENOUS

## 2018-07-17 MED ORDER — ROCURONIUM BROMIDE 10 MG/ML (PF) SYRINGE
PREFILLED_SYRINGE | INTRAVENOUS | Status: AC
  Filled 2018-07-17: qty 10

## 2018-07-17 MED ORDER — MIDAZOLAM HCL 2 MG/2ML IJ SOLN
INTRAMUSCULAR | Status: AC
  Filled 2018-07-17: qty 2

## 2018-07-17 MED ORDER — LACTATED RINGERS IV SOLN
INTRAVENOUS | Status: DC | PRN
  Administered 2018-07-17 (×2): via INTRAVENOUS

## 2018-07-17 MED ORDER — LIDOCAINE 2% (20 MG/ML) 5 ML SYRINGE
INTRAMUSCULAR | Status: DC | PRN
  Administered 2018-07-17: 100 mg via INTRAVENOUS

## 2018-07-17 MED ORDER — SODIUM CHLORIDE 0.9 % IV SOLN
INTRAVENOUS | Status: DC
  Administered 2018-07-17: 05:00:00 via INTRAVENOUS

## 2018-07-17 MED ORDER — FENTANYL CITRATE (PF) 250 MCG/5ML IJ SOLN
INTRAMUSCULAR | Status: AC
  Filled 2018-07-17: qty 5

## 2018-07-17 MED ORDER — MORPHINE SULFATE (PF) 2 MG/ML IV SOLN: 1.0000 mg | INTRAVENOUS | Status: DC | PRN

## 2018-07-17 MED ORDER — LIDOCAINE 2% (20 MG/ML) 5 ML SYRINGE
INTRAMUSCULAR | Status: AC
  Filled 2018-07-17: qty 5

## 2018-07-17 MED ORDER — MIDAZOLAM HCL 5 MG/5ML IJ SOLN
INTRAMUSCULAR | Status: DC | PRN
  Administered 2018-07-17: 2 mg via INTRAVENOUS

## 2018-07-17 MED ORDER — FENTANYL CITRATE (PF) 100 MCG/2ML IJ SOLN
25.0000 ug | INTRAMUSCULAR | Status: DC | PRN
  Administered 2018-07-17: 11:00:00 25 ug via INTRAVENOUS

## 2018-07-17 MED ORDER — CEFAZOLIN SODIUM-DEXTROSE 1-4 GM/50ML-% IV SOLN
1.0000 g | Freq: Once | INTRAVENOUS | Status: AC
  Administered 2018-07-17: 04:00:00 1 g via INTRAVENOUS
  Filled 2018-07-17: qty 50

## 2018-07-17 MED ORDER — HYDROMORPHONE HCL 1 MG/ML IJ SOLN
1.0000 mg | INTRAMUSCULAR | Status: DC | PRN
  Administered 2018-07-17 – 2018-07-18 (×6): 1 mg via INTRAVENOUS
  Filled 2018-07-17 (×6): qty 1

## 2018-07-17 MED ORDER — FENTANYL CITRATE (PF) 100 MCG/2ML IJ SOLN
INTRAMUSCULAR | Status: DC | PRN
  Administered 2018-07-17: 100 ug via INTRAVENOUS

## 2018-07-17 MED ORDER — CEFAZOLIN SODIUM-DEXTROSE 2-3 GM-%(50ML) IV SOLR
INTRAVENOUS | Status: DC | PRN
  Administered 2018-07-17: 2 g via INTRAVENOUS

## 2018-07-17 MED ORDER — LIDOCAINE HCL 1 % IJ SOLN
INTRAMUSCULAR | Status: AC
  Filled 2018-07-17: qty 20

## 2018-07-17 MED ORDER — DEXMEDETOMIDINE HCL IN NACL 200 MCG/50ML IV SOLN
INTRAVENOUS | Status: DC | PRN
  Administered 2018-07-17 (×5): 4 ug via INTRAVENOUS

## 2018-07-17 MED ORDER — BUPIVACAINE HCL (PF) 0.25 % IJ SOLN
INTRAMUSCULAR | Status: AC
  Filled 2018-07-17: qty 30

## 2018-07-17 MED ORDER — KETAMINE HCL 50 MG/5ML IJ SOSY
PREFILLED_SYRINGE | INTRAMUSCULAR | Status: AC
  Filled 2018-07-17: qty 5

## 2018-07-17 MED ORDER — DEXAMETHASONE SODIUM PHOSPHATE 10 MG/ML IJ SOLN
INTRAMUSCULAR | Status: AC
  Filled 2018-07-17: qty 1

## 2018-07-17 MED ORDER — ONDANSETRON HCL 4 MG/2ML IJ SOLN
INTRAMUSCULAR | Status: DC | PRN
  Administered 2018-07-17: 4 mg via INTRAVENOUS

## 2018-07-17 MED ORDER — SODIUM CHLORIDE 0.9 % IR SOLN
Status: DC | PRN
  Administered 2018-07-17: 3000 mL

## 2018-07-17 MED ORDER — PROPOFOL 10 MG/ML IV BOLUS
INTRAVENOUS | Status: AC
  Filled 2018-07-17: qty 20

## 2018-07-17 MED ORDER — ONDANSETRON HCL 4 MG/2ML IJ SOLN
INTRAMUSCULAR | Status: AC
  Filled 2018-07-17: qty 2

## 2018-07-17 MED ORDER — SUCCINYLCHOLINE CHLORIDE 20 MG/ML IJ SOLN
INTRAMUSCULAR | Status: DC | PRN
  Administered 2018-07-17: 140 mg via INTRAVENOUS

## 2018-07-17 MED ORDER — SUCCINYLCHOLINE CHLORIDE 200 MG/10ML IV SOSY
PREFILLED_SYRINGE | INTRAVENOUS | Status: AC
  Filled 2018-07-17: qty 10

## 2018-07-17 MED ORDER — ALBUMIN HUMAN 5 % IV SOLN
INTRAVENOUS | Status: DC | PRN
  Administered 2018-07-17: 10:00:00 via INTRAVENOUS

## 2018-07-17 MED ORDER — MORPHINE SULFATE (PF) 2 MG/ML IV SOLN
2.0000 mg | INTRAVENOUS | Status: DC | PRN
  Administered 2018-07-17 (×2): 2 mg via INTRAVENOUS
  Filled 2018-07-17 (×2): qty 1

## 2018-07-17 MED ORDER — FENTANYL CITRATE (PF) 100 MCG/2ML IJ SOLN
INTRAMUSCULAR | Status: AC
  Filled 2018-07-17: qty 2

## 2018-07-17 MED ORDER — IOHEXOL 350 MG/ML SOLN
75.0000 mL | Freq: Once | INTRAVENOUS | Status: AC | PRN
  Administered 2018-07-17: 04:00:00 75 mL via INTRAVENOUS

## 2018-07-17 MED ORDER — ROCURONIUM BROMIDE 50 MG/5ML IV SOSY
PREFILLED_SYRINGE | INTRAVENOUS | Status: DC | PRN
  Administered 2018-07-17: 50 mg via INTRAVENOUS

## 2018-07-17 MED ORDER — EPHEDRINE SULFATE-NACL 50-0.9 MG/10ML-% IV SOSY
PREFILLED_SYRINGE | INTRAVENOUS | Status: DC | PRN
  Administered 2018-07-17: 10 mg via INTRAVENOUS
  Administered 2018-07-17: 15 mg via INTRAVENOUS

## 2018-07-17 MED ORDER — SUGAMMADEX SODIUM 200 MG/2ML IV SOLN
INTRAVENOUS | Status: DC | PRN
  Administered 2018-07-17: 170 mg via INTRAVENOUS

## 2018-07-17 MED ORDER — SODIUM CHLORIDE 0.9 % IV SOLN
INTRAVENOUS | Status: DC | PRN
  Administered 2018-07-17: 50 ug/min via INTRAVENOUS

## 2018-07-17 MED ORDER — ONDANSETRON 4 MG PO TBDP: 4.0000 mg | ORAL_TABLET | Freq: Four times a day (QID) | ORAL | Status: DC | PRN

## 2018-07-17 MED ORDER — SODIUM CHLORIDE 0.9 % IV BOLUS
1000.0000 mL | Freq: Once | INTRAVENOUS | Status: AC
  Administered 2018-07-17: 1000 mL via INTRAVENOUS

## 2018-07-17 MED ORDER — 0.9 % SODIUM CHLORIDE (POUR BTL) OPTIME
TOPICAL | Status: DC | PRN
  Administered 2018-07-17: 09:00:00 1000 mL

## 2018-07-17 MED ORDER — DEXAMETHASONE SODIUM PHOSPHATE 10 MG/ML IJ SOLN
INTRAMUSCULAR | Status: DC | PRN
  Administered 2018-07-17: 10 mg via INTRAVENOUS

## 2018-07-17 MED ORDER — TETANUS-DIPHTH-ACELL PERTUSSIS 5-2.5-18.5 LF-MCG/0.5 IM SUSP
0.5000 mL | Freq: Once | INTRAMUSCULAR | Status: AC
  Administered 2018-07-17: 04:00:00 0.5 mL via INTRAMUSCULAR
  Filled 2018-07-17: qty 0.5

## 2018-07-17 MED ORDER — ONDANSETRON HCL 4 MG/2ML IJ SOLN: 4.0000 mg | Freq: Four times a day (QID) | INTRAMUSCULAR | Status: DC | PRN

## 2018-07-17 MED ORDER — BUPIVACAINE-EPINEPHRINE (PF) 0.5% -1:200000 IJ SOLN
INTRAMUSCULAR | Status: AC
  Filled 2018-07-17: qty 30

## 2018-07-17 MED ORDER — PHENYLEPHRINE 40 MCG/ML (10ML) SYRINGE FOR IV PUSH (FOR BLOOD PRESSURE SUPPORT)
PREFILLED_SYRINGE | INTRAVENOUS | Status: DC | PRN
  Administered 2018-07-17: 200 ug via INTRAVENOUS
  Administered 2018-07-17: 120 ug via INTRAVENOUS
  Administered 2018-07-17 (×2): 80 ug via INTRAVENOUS
  Administered 2018-07-17: 120 ug via INTRAVENOUS

## 2018-07-17 SURGICAL SUPPLY — 84 items
BANDAGE ACE 3X5.8 VEL STRL LF (GAUZE/BANDAGES/DRESSINGS) IMPLANT
BANDAGE ACE 4X5 VEL STRL LF (GAUZE/BANDAGES/DRESSINGS) ×4 IMPLANT
BLADE CLIPPER SURG (BLADE) ×4 IMPLANT
BNDG COHESIVE 1X5 TAN STRL LF (GAUZE/BANDAGES/DRESSINGS) IMPLANT
BNDG ELASTIC 2X5.8 VLCR STR LF (GAUZE/BANDAGES/DRESSINGS) ×4 IMPLANT
BNDG ESMARK 4X9 LF (GAUZE/BANDAGES/DRESSINGS) ×4 IMPLANT
BNDG GAUZE ELAST 4 BULKY (GAUZE/BANDAGES/DRESSINGS) ×4 IMPLANT
CANISTER SUCT 3000ML PPV (MISCELLANEOUS) IMPLANT
CORDS BIPOLAR (ELECTRODE) ×4 IMPLANT
COVER SURGICAL LIGHT HANDLE (MISCELLANEOUS) ×4 IMPLANT
COVER WAND RF STERILE (DRAPES) ×4 IMPLANT
CUFF TOURNIQUET SINGLE 18IN (TOURNIQUET CUFF) ×4 IMPLANT
CUFF TOURNIQUET SINGLE 24IN (TOURNIQUET CUFF) IMPLANT
DECANTER SPIKE VIAL GLASS SM (MISCELLANEOUS) IMPLANT
DERMABOND ADVANCED (GAUZE/BANDAGES/DRESSINGS)
DERMABOND ADVANCED .7 DNX12 (GAUZE/BANDAGES/DRESSINGS) IMPLANT
DRAPE LAPAROTOMY 100X72 PEDS (DRAPES) ×4 IMPLANT
DRAPE SURG 17X23 STRL (DRAPES) ×4 IMPLANT
DRSG ADAPTIC 3X8 NADH LF (GAUZE/BANDAGES/DRESSINGS) IMPLANT
ELECT REM PT RETURN 9FT ADLT (ELECTROSURGICAL) ×4
ELECTRODE REM PT RTRN 9FT ADLT (ELECTROSURGICAL) ×2 IMPLANT
GAUZE SPONGE 2X2 8PLY STRL LF (GAUZE/BANDAGES/DRESSINGS) IMPLANT
GAUZE SPONGE 4X4 12PLY STRL (GAUZE/BANDAGES/DRESSINGS) ×4 IMPLANT
GAUZE SPONGE 4X4 12PLY STRL LF (GAUZE/BANDAGES/DRESSINGS) ×4 IMPLANT
GAUZE XEROFORM 1X8 LF (GAUZE/BANDAGES/DRESSINGS) ×4 IMPLANT
GAUZE XEROFORM 5X9 LF (GAUZE/BANDAGES/DRESSINGS) ×4 IMPLANT
GLOVE BIOGEL M STRL SZ7.5 (GLOVE) ×4 IMPLANT
GLOVE BIOGEL PI IND STRL 8.5 (GLOVE) ×2 IMPLANT
GLOVE BIOGEL PI INDICATOR 8.5 (GLOVE) ×2
GLOVE INDICATOR 8.0 STRL GRN (GLOVE) ×4 IMPLANT
GLOVE SURG ORTHO 8.0 STRL STRW (GLOVE) ×4 IMPLANT
GOWN STRL REUS W/ TWL LRG LVL3 (GOWN DISPOSABLE) ×4 IMPLANT
GOWN STRL REUS W/ TWL XL LVL3 (GOWN DISPOSABLE) ×4 IMPLANT
GOWN STRL REUS W/TWL LRG LVL3 (GOWN DISPOSABLE) ×4
GOWN STRL REUS W/TWL XL LVL3 (GOWN DISPOSABLE) ×4
KIT BASIN OR (CUSTOM PROCEDURE TRAY) ×4 IMPLANT
KIT TURNOVER KIT B (KITS) ×4 IMPLANT
MANIFOLD NEPTUNE II (INSTRUMENTS) ×4 IMPLANT
NEEDLE HYPO 25GX1X1/2 BEV (NEEDLE) IMPLANT
NS IRRIG 1000ML POUR BTL (IV SOLUTION) ×4 IMPLANT
PACK GENERAL/GYN (CUSTOM PROCEDURE TRAY) IMPLANT
PACK ORTHO EXTREMITY (CUSTOM PROCEDURE TRAY) ×4 IMPLANT
PAD ARMBOARD 7.5X6 YLW CONV (MISCELLANEOUS) ×8 IMPLANT
PAD CAST 4YDX4 CTTN HI CHSV (CAST SUPPLIES) ×2 IMPLANT
PADDING CAST COTTON 4X4 STRL (CAST SUPPLIES) ×2
PENCIL SMOKE EVACUATOR (MISCELLANEOUS) IMPLANT
SET CYSTO W/LG BORE CLAMP LF (SET/KITS/TRAYS/PACK) IMPLANT
SOAP 2 % CHG 4 OZ (WOUND CARE) ×4 IMPLANT
SPECIMEN JAR MEDIUM (MISCELLANEOUS) ×4 IMPLANT
SPECIMEN JAR SMALL (MISCELLANEOUS) IMPLANT
SPLINT FIBERGLASS 3X35 (CAST SUPPLIES) ×4 IMPLANT
SPONGE GAUZE 2X2 STER 10/PKG (GAUZE/BANDAGES/DRESSINGS)
SUCTION FRAZIER HANDLE 10FR (MISCELLANEOUS) ×2
SUCTION TUBE FRAZIER 10FR DISP (MISCELLANEOUS) ×2 IMPLANT
SUT ETHILON 8 0 BV130 4 (SUTURE) IMPLANT
SUT ETHILON 9 0 BV130 4 (SUTURE) IMPLANT
SUT FIBER WIRE 4.0 (SUTURE) IMPLANT
SUT FIBERWIRE 2-0 18 17.9 3/8 (SUTURE)
SUT FIBERWIRE 3-0 18 TAPR NDL (SUTURE) ×8
SUT MERSILENE 4 0 P 3 (SUTURE) IMPLANT
SUT MNCRL AB 4-0 PS2 18 (SUTURE) IMPLANT
SUT PROLENE 3 0 PS 2 (SUTURE) ×8 IMPLANT
SUT PROLENE 4 0 PS 2 18 (SUTURE) IMPLANT
SUT PROLENE 5 0 PS 2 (SUTURE) IMPLANT
SUT PROLENE 6 0 P 1 18 (SUTURE) IMPLANT
SUT VIC AB 2-0 CT1 27 (SUTURE)
SUT VIC AB 2-0 CT1 TAPERPNT 27 (SUTURE) IMPLANT
SUT VIC AB 2-0 SH 27 (SUTURE) ×2
SUT VIC AB 2-0 SH 27XBRD (SUTURE) ×2 IMPLANT
SUT VIC AB 3-0 SH 18 (SUTURE) ×4 IMPLANT
SUT VIC AB 3-0 SH 27 (SUTURE)
SUT VIC AB 3-0 SH 27XBRD (SUTURE) IMPLANT
SUTURE FIBERWR 2-0 18 17.9 3/8 (SUTURE) IMPLANT
SUTURE FIBERWR 3-0 18 TAPR NDL (SUTURE) ×4 IMPLANT
SYR CONTROL 10ML LL (SYRINGE) IMPLANT
TAPE CLOTH SURG 4X10 WHT LF (GAUZE/BANDAGES/DRESSINGS) ×4 IMPLANT
TOWEL OR 17X24 6PK STRL BLUE (TOWEL DISPOSABLE) ×4 IMPLANT
TOWEL OR 17X26 10 PK STRL BLUE (TOWEL DISPOSABLE) ×4 IMPLANT
TUBE CONNECTING 12'X1/4 (SUCTIONS) ×2
TUBE CONNECTING 12X1/4 (SUCTIONS) ×6 IMPLANT
TUBE FEEDING 5FR 15 INCH (TUBING) IMPLANT
UNDERPAD 30X30 (UNDERPADS AND DIAPERS) ×8 IMPLANT
WATER STERILE IRR 1000ML POUR (IV SOLUTION) ×4 IMPLANT
YANKAUER SUCT BULB TIP NO VENT (SUCTIONS) ×4 IMPLANT
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
## 2018-07-17 NOTE — Plan of Care (Signed)
  Problem: Health Behavior/Discharge Planning: Goal: Ability to manage health-related needs will improve Outcome: Progressing   Problem: Education: Goal: Knowledge of General Education information will improve Description: Including pain rating scale, medication(s)/side effects and non-pharmacologic comfort measures Outcome: Progressing   
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
## 2018-07-17 NOTE — ED Notes (Signed)
Dr. Royce Macadamia ( trauma surgeon ) explained tests results and plan of care /surgery to patient .

## 2018-07-17 NOTE — Anesthesia Postprocedure Evaluation (Signed)
Anesthesia Post Note  Patient: Alejandro Elliott  Procedure(s) Performed: LEFT FOREARM REPAIR OF TENDON LACERATIONS AND OTHER NECESSARY PROCEDURE. (Left Arm Lower) WASHOUT AND REPAIR OF NECK LACERATIONS LEFT (Left Neck)     Patient location during evaluation: PACU Anesthesia Type: General Level of consciousness: sedated Pain management: pain level controlled Vital Signs Assessment: post-procedure vital signs reviewed and stable Respiratory status: spontaneous breathing and respiratory function stable Cardiovascular status: stable Postop Assessment: no apparent nausea or vomiting Anesthetic complications: no    Last Vitals:  Vitals:   07/17/18 1120 07/17/18 1132  BP: 126/77 125/72  Pulse: 82 80  Resp: 15   Temp:  36.6 C  SpO2: 94% 95%    Last Pain:  Vitals:   07/17/18 1132  TempSrc: Oral  PainSc:                  Damontay Alred DANIEL
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
## 2018-07-17 NOTE — Transfer of Care (Signed)
Immediate Anesthesia Transfer of Care Note  Patient: Alejandro Elliott  Procedure(s) Performed: LEFT FOREARM REPAIR OF TENDON LACERATIONS AND OTHER NECESSARY PROCEDURE. (Left Arm Lower) WASHOUT AND REPAIR OF NECK LACERATIONS LEFT (Left Neck)  Patient Location: PACU  Anesthesia Type:General  Level of Consciousness: drowsy and patient cooperative  Airway & Oxygen Therapy: Patient Spontanous Breathing  Post-op Assessment: Report given to RN and Post -op Vital signs reviewed and stable  Post vital signs: Reviewed and stable  Last Vitals:  Vitals Value Taken Time  BP 134/71 07/17/2018 10:48 AM  Temp    Pulse 89 07/17/2018 10:48 AM  Resp    SpO2 95 % 07/17/2018 10:48 AM  Vitals shown include unvalidated device data.  Last Pain:  Vitals:   07/17/18 0732  TempSrc:   PainSc: Asleep         Complications: No apparent anesthesia complications

## 2018-07-17 NOTE — ED Notes (Signed)
Paged hand to dr.palumbo

## 2018-07-17 NOTE — Anesthesia Procedure Notes (Signed)
Procedure Name: Intubation Date/Time: 07/17/2018 9:16 AM Performed by: Rosiland Oz, CRNA Pre-anesthesia Checklist: Patient identified, Emergency Drugs available, Suction available, Patient being monitored and Timeout performed Patient Re-evaluated:Patient Re-evaluated prior to induction Oxygen Delivery Method: Circle system utilized Preoxygenation: Pre-oxygenation with 100% oxygen Induction Type: IV induction and Rapid sequence Laryngoscope Size: Miller and 3 Grade View: Grade I Tube type: Oral Tube size: 7.5 mm Number of attempts: 1 Airway Equipment and Method: Stylet Placement Confirmation: ETT inserted through vocal cords under direct vision,  positive ETCO2 and breath sounds checked- equal and bilateral Secured at: 22 cm Tube secured with: Tape Dental Injury: Teeth and Oropharynx as per pre-operative assessment
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
## 2018-07-17 NOTE — Discharge Instructions (Signed)
KEEP BANDAGE CLEAN AND DRY CALL OFFICE FOR F/U APPT [phone] in 12 days KEEP HAND ELEVATED ABOVE HEART OK TO APPLY ICE TO OPERATIVE AREA CONTACT OFFICE IF ANY WORSENING PAIN OR CONCERNS. 

## 2018-07-17 NOTE — ED Notes (Signed)
Ortho tech at bedside applying left forearm splint.
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
## 2018-07-17 NOTE — ED Notes (Signed)
Patient transported to CT scan . 

## 2018-07-17 NOTE — Progress Notes (Signed)
Orthopedic Tech Progress Note Patient Details:  Alejandro Elliott 1990-05-25 [id number]  Ortho Devices Type of Ortho Device: Short arm splint Ortho Device/Splint Interventions: Application, Ordered   Post Interventions Patient Tolerated: Well Instructions Provided: Care of device, Adjustment of device   [name] 07/17/2018, 4:00 AM

## 2018-07-17 NOTE — Anesthesia Preprocedure Evaluation (Addendum)
Anesthesia Evaluation  Patient identified by MRN, date of birth, ID band Patient awake    Reviewed: Allergy & Precautions, NPO status , Patient's Chart, lab work & pertinent test results  History of Anesthesia Complications Negative for: history of anesthetic complications  Airway Mallampati: II  TM Distance: >3 FB Neck ROM: Full    Dental no notable dental hx. (+) Dental Advisory Given   Pulmonary neg pulmonary ROS,    Pulmonary exam normal        Cardiovascular negative cardio ROS Normal cardiovascular exam     Neuro/Psych negative neurological ROS  negative psych ROS   GI/Hepatic negative GI ROS, Neg liver ROS,   Endo/Other  negative endocrine ROS  Renal/GU negative Renal ROS  negative genitourinary   Musculoskeletal   Abdominal   Peds negative pediatric ROS (+)  Hematology negative hematology ROS (+)   Anesthesia Other Findings   Reproductive/Obstetrics negative OB ROS                            Anesthesia Physical Anesthesia Plan  ASA: II  Anesthesia Plan: General   Post-op Pain Management:    Induction: Intravenous  PONV Risk Score and Plan: 3 and Ondansetron, Dexamethasone and Scopolamine patch - Pre-op  Airway Management Planned: Oral ETT  Additional Equipment:   Intra-op Plan:   Post-operative Plan: Extubation in OR  Informed Consent: I have reviewed the patients History and Physical, chart, labs and discussed the procedure including the risks, benefits and alternatives for the proposed anesthesia with the patient or authorized representative who has indicated his/her understanding and acceptance.     Dental advisory given  Plan Discussed with: Anesthesiologist, Surgeon and CRNA  Anesthesia Plan Comments:        Anesthesia Quick Evaluation

## 2018-07-18 ENCOUNTER — Encounter (HOSPITAL_COMMUNITY): Payer: Self-pay | Admitting: Orthopedic Surgery

## 2018-07-18 MED ORDER — OXYCODONE HCL 5 MG PO TABS: 5.0000 mg | ORAL_TABLET | Freq: Four times a day (QID) | ORAL | 0 refills | Status: DC | PRN
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
## 2018-07-18 NOTE — Progress Notes (Signed)
Patient discharging home. Discharge instructions explained to patient and he verbalized understanding. Took all personal belongings. No further questions or concerns voiced. Left via personal car.

## 2018-07-18 NOTE — Plan of Care (Signed)
  Problem: Education: Goal: Knowledge of General Education information will improve Description: Including pain rating scale, medication(s)/side effects and non-pharmacologic comfort measures Outcome: Progressing   Problem: Health Behavior/Discharge Planning: Goal: Ability to manage health-related needs will improve Outcome: Progressing   Problem: Clinical Measurements: Goal: Ability to maintain clinical measurements within normal limits will improve Outcome: Progressing Goal: Respiratory complications will improve Outcome: Progressing   Problem: Coping: Goal: Level of anxiety will decrease Outcome: Progressing   

## 2018-07-19 ENCOUNTER — Encounter (HOSPITAL_COMMUNITY): Payer: Self-pay | Admitting: Emergency Medicine

## 2018-07-30 ENCOUNTER — Emergency Department (HOSPITAL_COMMUNITY)
Admission: EM | Admit: 2018-07-30 | Discharge: 2018-07-31 | Disposition: A | Discharge status: Home / Self Care | Payer: Self-pay | Attending: Emergency Medicine | Admitting: Emergency Medicine

## 2018-07-30 ENCOUNTER — Encounter (HOSPITAL_COMMUNITY): Payer: Self-pay | Admitting: Emergency Medicine

## 2018-07-30 DIAGNOSIS — Z4789 Encounter for other orthopedic aftercare: Secondary | ICD-10-CM | POA: Insufficient documentation

## 2018-07-30 DIAGNOSIS — Z4689 Encounter for fitting and adjustment of other specified devices: Secondary | ICD-10-CM | POA: Insufficient documentation
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
## 2018-07-31 MED ORDER — OXYCODONE-ACETAMINOPHEN 5-325 MG PO TABS
1.0000 | ORAL_TABLET | Freq: Once | ORAL | Status: AC
  Administered 2018-07-31: 01:00:00 1 via ORAL
  Filled 2018-07-31: qty 1
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
## 2018-07-31 NOTE — ED Notes (Signed)
Tech placed splint

## 2018-08-17 ENCOUNTER — Other Ambulatory Visit: Payer: Self-pay

## 2018-08-17 ENCOUNTER — Ambulatory Visit: Payer: Self-pay | Attending: Orthopedic Surgery | Admitting: Occupational Therapy

## 2018-08-17 DIAGNOSIS — M79632 Pain in left forearm: Secondary | ICD-10-CM | POA: Insufficient documentation

## 2018-08-17 DIAGNOSIS — M6281 Muscle weakness (generalized): Secondary | ICD-10-CM | POA: Insufficient documentation

## 2018-08-17 DIAGNOSIS — M25632 Stiffness of left wrist, not elsewhere classified: Secondary | ICD-10-CM | POA: Insufficient documentation

## 2018-08-17 NOTE — Therapy (Signed)
[facility] [address] [place], [place], [address] Phone: [phone]   Fax:  [phone]  Occupational Therapy Evaluation  Patient Details  Name: Alejandro Elliott MRN: [id number] Date of Birth: Dec 02, 1990 Referring Provider (OT): Lambert Mody, [place]   Encounter Date: 08/17/2018  OT End of Session - 08/17/18 1348    Visit Number  1    Number of Visits  8    Date for OT Re-Evaluation  10/17/18    Authorization Type  self pay    OT Start Time  1240    OT Stop Time  1340    OT Time Calculation (min)  60 min    Activity Tolerance  Patient tolerated treatment well    Behavior During Therapy  [facility] for tasks assessed/performed       Past Medical History:  Diagnosis Date  . Asthma   . Cyst near tailbone     Past Surgical History:  Procedure Laterality Date  . INCISION AND DRAINAGE OF WOUND Left 07/17/2018   Procedure: WASHOUT AND REPAIR OF NECK LACERATIONS LEFT;  Surgeon: Bradly Bienenstock, MD;  Location: [facility] OR;  Service: Orthopedics;  Laterality: Left;  . LACERATION REPAIR Left 07/17/2018   Procedure: LEFT FOREARM REPAIR OF TENDON LACERATIONS AND OTHER NECESSARY PROCEDURE.;  Surgeon: Bradly Bienenstock, MD;  Location: [facility] OR;  Service: Orthopedics;  Laterality: Left;  Marland Kitchen MANDIBLE FRACTURE SURGERY      There were no vitals filed for this visit.  Subjective Assessment - 08/17/18 1243    Currently in Pain?  Yes  (Pended)     Pain Score  10-Worst pain ever  (Pended)     Pain Location  --  (Pended)    forearm   Pain Orientation  Left  (Pended)     Pain Descriptors / Indicators  Cramping  (Pended)     Pain Type  Acute pain;Surgical pain  (Pended)     Pain Frequency  Intermittent  (Pended)     Aggravating Factors   movement  (Pended)         [facility] OT Assessment - 08/17/18 0001      Assessment   Medical Diagnosis  s/p Lt forearm extensor tendon repair zone VIII including APL repair of thumb    Referring Provider (OT)  Lambert Mody, PA-C    Onset Date/Surgical Date  07/17/18   Surgical date   Hand Dominance  Right      Precautions   Precautions  Other (comment)    Precaution Comments  splint on at all times except with exercises. 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 Needs assist for all IADLS      Written Expression   Dominant Hand  Right    Handwriting  --   NO changes     Observation/Other Assessments   Observations  Pt arrived late directly from MD office w/ surgical cast  still on. Pt had to be asked several times to turn phone off while trying to educate pt in exercises. Pt did not want to return demo because he needed to leave.                OT Treatments/Exercises (OP) - 08/17/18 0001      Exercises   Exercises  --   See pt instructions for details on A/ROM HEP      Splinting   Splinting  Fabricated and fitted splint per protocol for extenson tendon repair zone VIII. Also included thumb d/t APL repair and followed thumb position for this. Issued splint and educated pt in splint wear and care            OT Education - 08/17/18 1327    Education Details  splint wear and care, precautions, A/ROM HEP for wrist, fingers and thumb (per protocol)    Person(s) Educated  Patient    Methods  Explanation;Handout    Comprehension  Verbalized understanding       OT Short Term Goals - 08/17/18 1629      OT SHORT TERM GOAL #1   Title  Independent with splint wear and care    Time  4    Period  Weeks    Status  On-going       OT SHORT TERM GOAL #2   Title  Independent with A/ROM HEP     Time  4    Period  Weeks    Status  On-going      OT SHORT TERM GOAL #3   Title  Pt to have 90% or greater full composite flexion and extension Lt hand    Time  4    Period  Weeks    Status  New      OT SHORT TERM GOAL #4   Title  Pt to demo wrist ROM WFL's to perform functional tasks    Time  4    Period  Weeks    Status  New        OT Long Term Goals - 08/17/18 1631      OT LONG TERM GOAL #1   Title  Independent with updated HEP     Time  8    Period  Weeks    Status  New      OT LONG TERM GOAL #2   Title  Grip strength Lt hand to be 50% or greater compared to that of Rt hand    Time  8    Period  Weeks    Status  New      OT LONG TERM GOAL #3   Title  Pt to return to using Lt hand as non dominant hand for all bilateral tasks/IADLS    Time  8    Period  Weeks    Status  New      OT LONG TERM GOAL #4   Title  Pain less than or equal to 4/10 for functional tasks    Time  8    Period  Weeks    Status  New            Plan - 08/17/18 1624    Clinical Impression Statement  Pt is a 28 y.o. male who presents to outpatient rehab s/p extensor tendon repair (zone 8) and APL repair on 07/17/18. Pt is now > 4 weeks post op. Pt seen today for evaluation, splinting, and initiation of A/ROM HEP  outside splint per protocol.     OT Occupational Profile and History  Problem Focused Assessment - Including review of records relating to presenting problem    Occupational performance deficits (Please refer to evaluation for details):  IADL's;Work    Body Structure / Function / Physical Skills  ADL;ROM;UE functional use;FMC;Scar mobility;Dexterity;Sensation;Edema;Pain;Strength;IADL    Rehab Potential  Good    Clinical Decision Making  Limited treatment options, no task modification necessary    Comorbidities Affecting Occupational Performance:  May have comorbidities impacting occupational performance    Modification  or Assistance to Complete Evaluation   Min-Moderate modification of tasks or assist with assess necessary to complete eval    OT Frequency  1x / week    OT Duration  8 weeks    OT Treatment/Interventions  Self-care/ADL training;Electrical Stimulation;Therapeutic exercise;Moist Heat;Paraffin;Splinting;Patient/family education;Fluidtherapy;Therapeutic activities;DME and/or AE instruction;Scar mobilization;Contrast Bath;Ultrasound;Cryotherapy;Manual Therapy;Passive range of motion    Plan  review HEP and have patient return demo, assess ROM prn, progress per protocol.     Consulted and Agree with Plan of Care  Patient       Patient will benefit from skilled therapeutic intervention in order to improve the following deficits and impairments:  Body Structure / Function / Physical Skills  Visit Diagnosis: Pain in left forearm  Muscle weakness (generalized)  Stiffness of left wrist, not elsewhere classified    Problem List Patient Active Problem List   Diagnosis Date Noted  . Laceration of extensor tendon of left forearm 07/17/2018    Kelli ChurnBallie, Jhett Fretwell Johnson, OTR/L 08/17/2018, 4:35 PM  [facility] [address] [place], [place], [address] Phone: [phone]   Fax:  [phone]  Name: Alejandro Elliott MRN: [id number] Date of Birth: 1990/08/13

## 2018-08-17 NOTE — Patient Instructions (Signed)
WEARING SCHEDULE:  Wear splint at ALL times except for hygiene care (May remove splint for exercises and then immediately place back on ONLY if directed by the therapist)  PURPOSE:  To prevent movement and for protection until injury can heal  CARE OF SPLINT:  Keep splint away from heat sources including: stove, radiator or furnace, or a car in sunlight. The splint can melt and will no longer fit you properly  Keep away from pets and children  Clean the splint with rubbing alcohol 1-2 times per day.  * During this time, make sure you also clean your hand/arm as instructed by your therapist and/or perform dressing changes as needed. Then dry hand/arm completely before replacing splint. (When cleaning hand/arm, keep it immobilized in same position until splint is replaced)  PRECAUTIONS/POTENTIAL PROBLEMS: *If you notice or experience increased pain, swelling, numbness, or a lingering reddened area from the splint: Contact your therapist immediately by calling [phone]. You must wear the splint for protection, but we will get you scheduled for adjustments as quickly as possible.  (If only straps or hooks need to be replaced and NO adjustments to the splint need to be made, just call the office ahead and let them know you are coming in)  If you have any medical concerns or signs of infection, please call your doctor immediately   Take splint off to do below exercises every 1-2 hours. Then put splint back on:   1. Open and close hand while wrist is up, neutral, and down x 5 in each position.  2. Make a gentle fist, then bring wrist down for a stretch - hold 10 sec. Repeat 5 times 3. Hand flat on table, move wrist/hand side to side like a windshield wiper, then repeat with wrist up and then with wrist down x 5 each position. Then repeat all with palm up 4. Make a fist, then just straighten big knuckles while keeping fingers bent x 10 reps.  5. For thumb, make a "4" then a "5" x 10 reps each  way.  6. Do big circles with your thumb

## 2018-08-24 ENCOUNTER — Ambulatory Visit: Payer: Self-pay | Admitting: Occupational Therapy

## 2018-08-31 ENCOUNTER — Other Ambulatory Visit: Payer: Self-pay

## 2018-08-31 ENCOUNTER — Ambulatory Visit: Payer: Self-pay | Admitting: Occupational Therapy

## 2018-08-31 DIAGNOSIS — M25632 Stiffness of left wrist, not elsewhere classified: Secondary | ICD-10-CM

## 2018-08-31 DIAGNOSIS — M79632 Pain in left forearm: Secondary | ICD-10-CM

## 2018-08-31 NOTE — Therapy (Signed)
[facility] [address] [place], [place], [address] Phone: [phone]   Fax:  [phone]  Occupational Therapy Treatment  Patient Details  Name: Alejandro Elliott MRN: [id number] Date of Birth: 1990/07/13 Referring Provider (OT): Gertie Fey, [place]   Encounter Date: 08/31/2018  OT End of Session - 08/31/18 1451    Visit Number  2    Number of Visits  8    Date for OT Re-Evaluation  10/17/18    Authorization Type  self pay    OT Start Time  1405    OT Stop Time  1448    OT Time Calculation (min)  43 min    Activity Tolerance  Patient tolerated treatment well    Behavior During Therapy  [facility] for tasks assessed/performed       Past Medical History:  Diagnosis Date  . Asthma   . Cyst near tailbone     Past Surgical History:  Procedure Laterality Date  . INCISION AND DRAINAGE OF WOUND Left 07/17/2018   Procedure: WASHOUT AND REPAIR OF NECK LACERATIONS LEFT;  Surgeon: Iran Planas, MD;  Location: [facility];  Service: Orthopedics;  Laterality: Left;  . LACERATION REPAIR Left 07/17/2018   Procedure: LEFT FOREARM REPAIR OF TENDON LACERATIONS AND OTHER NECESSARY PROCEDURE.;  Surgeon: Iran Planas, MD;  Location: [place] [place];  Service: Orthopedics;  Laterality: Left;  Marland Kitchen MANDIBLE FRACTURE SURGERY      There were no vitals filed for this visit.  Subjective Assessment - 08/31/18 1409    Subjective   I've been doing the exercises. But the velcro keeps falling off my splint, so I'm just wearing the one they gave me after surgery (wrist brace)    Pertinent History  extensor tendon repair zone VIII 07/17/18    Limitations  no strengthening at this time    Currently in Pain?  Yes    Pain Score  6     Pain Location  --   FOREARM   Pain Orientation  Left    Pain Descriptors / Indicators  Sharp    Pain Type  Acute pain    Pain Onset  More than a month ago    Pain Frequency  Intermittent    Aggravating Factors   Only with certain  movements    Pain Relieving Factors  rest       CLINIC OPERATION CHANGES: Outpatient Neuro Rehab is open at lower capacity following universal masking, social distancing, and patient screening.  The patient's COVID risk of complications score is 1.  Pt arrived wearing pre-fab wrist brace and no longer wearing thermoplast splint made by therapist (per protocol). Pt reports velcro keeps falling off although pt has come to therapy clinic to get velcro reattached.   Reviewed A/ROM HEP. Had pt return demo of each x 5-10 reps. Pt now > 6 weeks post-op, however, pt has full active ROM at wrist and fingers at this time and does not need P/ROM. Will begin strengthening next week per protocol  Ultrasound x 8 min. To dorsal forearm at incision area at 3 Mhz, 20% pulsed, 0.8 wts/cm2                     OT Short Term Goals - 08/31/18 1452      OT SHORT TERM GOAL #1   Title  Independent with splint wear and care    Time  4    Period  Weeks    Status  Achieved  OT SHORT TERM GOAL #2   Title  Independent with A/ROM HEP     Time  4    Period  Weeks    Status  Achieved      OT SHORT TERM GOAL #3   Title  Pt to have 90% or greater full composite flexion and extension Lt hand    Time  4    Period  Weeks    Status  Achieved      OT SHORT TERM GOAL #4   Title  Pt to demo wrist ROM WFL's to perform functional tasks    Time  4    Period  Weeks    Status  Achieved        OT Long Term Goals - 08/17/18 1631      OT LONG TERM GOAL #1   Title  Independent with updated HEP     Time  8    Period  Weeks    Status  New      OT LONG TERM GOAL #2   Title  Grip strength Lt hand to be 50% or greater compared to that of Rt hand    Time  8    Period  Weeks    Status  New      OT LONG TERM GOAL #3   Title  Pt to return to using Lt hand as non dominant hand for all bilateral tasks/IADLS    Time  8    Period  Weeks    Status  New      OT LONG TERM GOAL #4   Title  Pain  less than or equal to 4/10 for functional tasks    Time  8    Period  Weeks    Status  New            Plan - 08/31/18 1452    Clinical Impression Statement  Pt has met all STG's despite missing appointment last week.    Occupational performance deficits (Please refer to evaluation for details):  IADL's;Work    Body Structure / Function / Physical Skills  ADL;ROM;UE functional use;FMC;Scar mobility;Dexterity;Sensation;Edema;Pain;Strength;IADL    Rehab Potential  Good    OT Frequency  1x / week    OT Duration  8 weeks    OT Treatment/Interventions  Self-care/ADL training;Electrical Stimulation;Therapeutic exercise;Moist Heat;Paraffin;Splinting;Patient/family education;Fluidtherapy;Therapeutic activities;DME and/or AE instruction;Scar mobilization;Contrast Bath;Ultrasound;Cryotherapy;Manual Therapy;Passive range of motion    Plan  Progress to light strengthening next week per protocol       Patient will benefit from skilled therapeutic intervention in order to improve the following deficits and impairments:   Body Structure / Function / Physical Skills: ADL, ROM, UE functional use, FMC, Scar mobility, Dexterity, Sensation, Edema, Pain, Strength, IADL       Visit Diagnosis: 1. Pain in left forearm   2. Stiffness of left wrist, not elsewhere classified       Problem List Patient Active Problem List   Diagnosis Date Noted  . Laceration of extensor tendon of left forearm 07/17/2018    Carey Bullocks, OTR/L 08/31/2018, 4:07 PM  [facility] [address] [place], [place], [address] Phone: [phone]   Fax:  [phone]  Name: Alejandro Elliott MRN: [id number] Date of Birth: [birth year]

## 2018-09-02 ENCOUNTER — Encounter (HOSPITAL_BASED_OUTPATIENT_CLINIC_OR_DEPARTMENT_OTHER): Payer: Self-pay

## 2018-09-02 ENCOUNTER — Other Ambulatory Visit: Payer: Self-pay

## 2018-09-02 DIAGNOSIS — Z79899 Other long term (current) drug therapy: Secondary | ICD-10-CM | POA: Insufficient documentation

## 2018-09-02 DIAGNOSIS — Y999 Unspecified external cause status: Secondary | ICD-10-CM | POA: Insufficient documentation

## 2018-09-02 DIAGNOSIS — M25552 Pain in left hip: Secondary | ICD-10-CM | POA: Diagnosis not present

## 2018-09-02 DIAGNOSIS — S79911A Unspecified injury of right hip, initial encounter: Secondary | ICD-10-CM | POA: Diagnosis present

## 2018-09-02 DIAGNOSIS — R0789 Other chest pain: Secondary | ICD-10-CM | POA: Insufficient documentation

## 2018-09-02 DIAGNOSIS — Y9389 Activity, other specified: Secondary | ICD-10-CM | POA: Diagnosis not present

## 2018-09-02 DIAGNOSIS — T07XXXA Unspecified multiple injuries, initial encounter: Secondary | ICD-10-CM | POA: Diagnosis not present

## 2018-09-02 DIAGNOSIS — Y929 Unspecified place or not applicable: Secondary | ICD-10-CM | POA: Diagnosis not present

## 2018-09-02 DIAGNOSIS — M542 Cervicalgia: Secondary | ICD-10-CM | POA: Insufficient documentation

## 2018-09-02 DIAGNOSIS — J45909 Unspecified asthma, uncomplicated: Secondary | ICD-10-CM | POA: Insufficient documentation
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
## 2018-09-03 ENCOUNTER — Emergency Department (HOSPITAL_BASED_OUTPATIENT_CLINIC_OR_DEPARTMENT_OTHER)
Admission: EM | Admit: 2018-09-03 | Discharge: 2018-09-03 | Disposition: A | Discharge status: Home / Self Care | Payer: No Typology Code available for payment source | Attending: Emergency Medicine | Admitting: Emergency Medicine

## 2018-09-03 DIAGNOSIS — T07XXXA Unspecified multiple injuries, initial encounter: Secondary | ICD-10-CM

## 2018-09-03 MED ORDER — HYDROCODONE-ACETAMINOPHEN 5-325 MG PO TABS
1.0000 | ORAL_TABLET | Freq: Once | ORAL | Status: AC
  Administered 2018-09-03: 03:00:00 1 via ORAL
  Filled 2018-09-03: qty 1

## 2018-09-03 MED ORDER — NAPROXEN 250 MG PO TABS
500.0000 mg | ORAL_TABLET | Freq: Once | ORAL | Status: AC
  Administered 2018-09-03: 500 mg via ORAL
  Filled 2018-09-03: qty 2

## 2018-09-03 MED ORDER — NAPROXEN 500 MG PO TABS: ORAL_TABLET | ORAL | 0 refills | Status: DC

## 2018-09-03 NOTE — ED Notes (Signed)
Patient called out requesting pain medication. RN stated that he could get him tylenol or motrin. Pt decided to hold off until provider sees him
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
## 2018-09-03 NOTE — ED Notes (Signed)
Pt was able to ambulate without assistance to sons room next door.

## 2018-09-03 NOTE — ED Notes (Signed)
Pt not in lobby.  

## 2018-09-03 NOTE — ED Notes (Signed)
Pt understood dc material. NAD noted. Script given at [employer]. All questions given at dc. Pt escorted to check out counter.

## 2018-09-03 NOTE — ED Notes (Signed)
Pt found outside.  

## 2018-09-03 NOTE — ED Notes (Signed)
ED Provider at bedside. 

## 2018-09-05 ENCOUNTER — Encounter (HOSPITAL_COMMUNITY): Payer: Self-pay | Admitting: Emergency Medicine

## 2018-09-05 ENCOUNTER — Emergency Department (HOSPITAL_COMMUNITY): Payer: No Typology Code available for payment source

## 2018-09-05 ENCOUNTER — Emergency Department (HOSPITAL_COMMUNITY)
Admission: EM | Admit: 2018-09-05 | Discharge: 2018-09-05 | Disposition: A | Discharge status: Home / Self Care | Payer: No Typology Code available for payment source | Attending: Emergency Medicine | Admitting: Emergency Medicine

## 2018-09-05 DIAGNOSIS — M25552 Pain in left hip: Secondary | ICD-10-CM | POA: Insufficient documentation

## 2018-09-05 DIAGNOSIS — J45909 Unspecified asthma, uncomplicated: Secondary | ICD-10-CM | POA: Insufficient documentation

## 2018-09-05 DIAGNOSIS — R0789 Other chest pain: Secondary | ICD-10-CM | POA: Diagnosis present
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
## 2018-09-05 NOTE — Discharge Instructions (Signed)
Take ibuprofen 3 times a day with meals. Take 3 pills (600 mg) at a time. Do not take other anti-inflammatories at the same time (Advil, Motrin, naproxen, Aleve). You may supplement with Tylenol if you need further pain control. Use muscle creams such as salonpas, icy hot, or bengay for pain control.  Use ice packs or heating pads if this helps control your pain. You will likely have continued muscle stiffness and soreness over the next couple days.  Return to the emergency room if you develop vision changes, vomiting, slurred speech, numbness, loss of bowel or bladder control, or any new or worsening symptoms.
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
## 2018-09-08 ENCOUNTER — Ambulatory Visit: Payer: Self-pay | Admitting: Occupational Therapy

## 2018-09-08 ENCOUNTER — Other Ambulatory Visit: Payer: Self-pay

## 2018-09-08 DIAGNOSIS — M25632 Stiffness of left wrist, not elsewhere classified: Secondary | ICD-10-CM

## 2018-09-08 DIAGNOSIS — M6281 Muscle weakness (generalized): Secondary | ICD-10-CM

## 2018-09-08 NOTE — Patient Instructions (Signed)
  Extension (Resistive)    With wrist over edge of table, lift __1__ lb (or can of veggies), keeping arm on table surface. Hold __3__ seconds. Lower slowly. Repeat __10__ times. Do __3__ sessions per day.   Flexion (Resistive)    With hand palm-up and holding _1 lb___ (or can of veggies), bend hand toward you at wrist. Hold __3__ seconds. Relax slowly. Repeat _10___ times. Do __3__ sessions per day.  1. Grip Strengthening (Resistive Putty)   Squeeze putty using thumb and all fingers. Repeat _20___ times. Do __2__ sessions per day.   2. Roll putty into tube on table and pinch between each finger and thumb x 10 reps each. (Do ring and small finger together)   3. Extension (Resistive Putty)    Place putty loop around fingers. Stretch loop by opening hand at large knuckles only. Keep thumb still and finger- tips straight. Repeat _10___ times. Do __2__ sessions per day.
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
## 2018-09-14 ENCOUNTER — Other Ambulatory Visit: Payer: Self-pay

## 2018-09-14 ENCOUNTER — Ambulatory Visit: Payer: Self-pay | Admitting: Occupational Therapy

## 2018-09-14 DIAGNOSIS — M25632 Stiffness of left wrist, not elsewhere classified: Secondary | ICD-10-CM

## 2018-09-14 DIAGNOSIS — M6281 Muscle weakness (generalized): Secondary | ICD-10-CM

## 2018-09-14 NOTE — Therapy (Signed)
[facility] [address] [place], [place], [address] Phone: [phone]   Fax:  [phone]  Occupational Therapy Treatment  Patient Details  Name: Alejandro Elliott MRN: [id number] Date of Birth: July 02, 1990 Referring Provider (OT): Gertie Fey, [place]   Encounter Date: 09/14/2018  OT End of Session - 09/14/18 1217    Visit Number  4    Number of Visits  8    Date for OT Re-Evaluation  10/17/18    Authorization Type  self pay    OT Start Time  1200    OT Stop Time  1238    OT Time Calculation (min)  38 min    Activity Tolerance  Patient tolerated treatment well    Behavior During Therapy  [facility] for tasks assessed/performed       Past Medical History:  Diagnosis Date  . Asthma   . Cyst near tailbone     Past Surgical History:  Procedure Laterality Date  . INCISION AND DRAINAGE OF WOUND Left 07/17/2018   Procedure: WASHOUT AND REPAIR OF NECK LACERATIONS LEFT;  Surgeon: Iran Planas, MD;  Location: [facility];  Service: Orthopedics;  Laterality: Left;  . LACERATION REPAIR Left 07/17/2018   Procedure: LEFT FOREARM REPAIR OF TENDON LACERATIONS AND OTHER NECESSARY PROCEDURE.;  Surgeon: Iran Planas, MD;  Location: [facility];  Service: Orthopedics;  Laterality: Left;  Marland Kitchen MANDIBLE FRACTURE SURGERY      There were no vitals filed for this visit.  Subjective Assessment - 09/14/18 1203    Subjective   I feel like it's doing a lot better.    Pertinent History  extensor tendon repair zone VIII 07/17/18    Limitations  no strengthening at this time    Currently in Pain?  No/denies         [facility] OT Assessment - 09/14/18 0001      Hand Function   Left Hand Grip (lbs)  6/24 = 25 lbs, 6/30 = 44 lbs               OT Treatments/Exercises (OP) - 09/14/18 0001      Exercises   Exercises  Wrist;Hand      Wrist Exercises   Wrist Flexion  Strengthening;10 reps   2 lb x 1 set, 3 lb x 1 set of 10   Wrist Extension   Strengthening;10 reps   2 lb x 2 sets   Wrist Radial Deviation  10 reps;Strengthening   2 lb weight     Hand Exercises   Other Hand Exercises  Isolated finger extension ex's x 5 reps each finger    Other Hand Exercises  Updated putty to green resistance and performed mass grasp x 20 reps, finger pinch x 10 reps      Modalities   Modalities  Ultrasound      Ultrasound   Ultrasound Location  dorsal forearm along incision    Ultrasound Parameters  3 Mhz, 0.8 wts/cm2, 20% pulsed x 8 min               OT Short Term Goals - 08/31/18 1452      OT SHORT TERM GOAL #1   Title  Independent with splint wear and care    Time  4    Period  Weeks    Status  Achieved      OT SHORT TERM GOAL #2   Title  Independent with A/ROM HEP     Time  4  Period  Weeks    Status  Achieved      OT SHORT TERM GOAL #3   Title  Pt to have 90% or greater full composite flexion and extension Lt hand    Time  4    Period  Weeks    Status  Achieved      OT SHORT TERM GOAL #4   Title  Pt to demo wrist ROM WFL's to perform functional tasks    Time  4    Period  Weeks    Status  Achieved        OT Long Term Goals - 09/14/18 1245      OT LONG TERM GOAL #1   Title  Independent with updated HEP     Time  8    Period  Weeks    Status  Achieved      OT LONG TERM GOAL #2   Title  Grip strength Lt hand to be 50% or greater compared to that of Rt hand    Time  8    Period  Weeks    Status  On-going   09/08/18: RT = 91 lbs, LT = 25 lbs,  09/14/18: Lt = 44 lbs     OT LONG TERM GOAL #3   Title  Pt to return to using Lt hand as non dominant hand for all bilateral tasks/IADLS    Time  8    Period  Weeks    Status  On-going      OT LONG TERM GOAL #4   Title  Pain less than or equal to 4/10 for functional tasks    Time  8    Period  Weeks    Status  Achieved            Plan - 09/14/18 1244    Clinical Impression Statement  Pt gradually increasing wrist strengthening. Pt also  significantly improved in grip strength since last seen    Body Structure / Function / Physical Skills  ADL;ROM;UE functional use;FMC;Scar mobility;Dexterity;Sensation;Edema;Pain;Strength;IADL    Rehab Potential  Good    OT Frequency  1x / week    OT Duration  8 weeks    OT Treatment/Interventions  Self-care/ADL training;Electrical Stimulation;Therapeutic exercise;Moist Heat;Paraffin;Splinting;Patient/family education;Fluidtherapy;Therapeutic activities;DME and/or AE instruction;Scar mobilization;Contrast Bath;Ultrasound;Cryotherapy;Manual Therapy;Passive range of motion    Plan  begin with pulsed US, continue wrist strengthening, assess remaining goals and anticipate d/c    Consulted and Agree with Plan of Care  Patient       Patient will benefit from skilled therapeutic intervention in order to improve the following deficits and impairments:   Body Structure / Function / Physical Skills: ADL, ROM, UE functional use, FMC, Scar mobility, Dexterity, Sensation, Edema, Pain, Strength, IADL       Visit Diagnosis: 1. Muscle weakness (generalized)   2. Stiffness of left wrist, not elsewhere classified       Problem List Patient Active Problem List   Diagnosis Date Noted  . Laceration of extensor tendon of left forearm 07/17/2018    Kelli ChurnBallie, Leobardo Granlund Johnson, OTR/L 09/14/2018, 12:47 PM  [facility] [address] [place], [place], [address] Phone: [phone]   Fax:  [phone]  Name: Alejandro Elliott MRN: [id number] Date of Birth: 1990-07-16

## 2018-09-16 ENCOUNTER — Encounter (HOSPITAL_COMMUNITY): Payer: Self-pay

## 2018-09-16 ENCOUNTER — Emergency Department (HOSPITAL_COMMUNITY): Payer: Self-pay

## 2018-09-16 ENCOUNTER — Emergency Department (HOSPITAL_COMMUNITY)
Admission: EM | Admit: 2018-09-16 | Discharge: 2018-09-17 | Disposition: A | Discharge status: Home / Self Care | Payer: Self-pay | Attending: Emergency Medicine | Admitting: Emergency Medicine

## 2018-09-16 ENCOUNTER — Other Ambulatory Visit: Payer: Self-pay

## 2018-09-16 DIAGNOSIS — J45909 Unspecified asthma, uncomplicated: Secondary | ICD-10-CM | POA: Insufficient documentation

## 2018-09-16 DIAGNOSIS — F10121 Alcohol abuse with intoxication delirium: Secondary | ICD-10-CM | POA: Insufficient documentation

## 2018-09-16 DIAGNOSIS — F10921 Alcohol use, unspecified with intoxication delirium: Secondary | ICD-10-CM

## 2018-09-16 DIAGNOSIS — R569 Unspecified convulsions: Secondary | ICD-10-CM | POA: Insufficient documentation

## 2018-09-16 DIAGNOSIS — T50904A Poisoning by unspecified drugs, medicaments and biological substances, undetermined, initial encounter: Secondary | ICD-10-CM | POA: Insufficient documentation

## 2018-09-16 HISTORY — DX: Unspecified convulsions: R56.9

## 2018-09-16 LAB — RAPID URINE DRUG SCREEN, HOSP PERFORMED
Amphetamines: NOT DETECTED
Barbiturates: NOT DETECTED
Benzodiazepines: NOT DETECTED
Cocaine: POSITIVE — AB
Opiates: NOT DETECTED
Tetrahydrocannabinol: POSITIVE — AB

## 2018-09-16 LAB — CBG MONITORING, ED: Glucose-Capillary: 73 mg/dL (ref 70–99)

## 2018-09-16 LAB — URINALYSIS, COMPLETE (UACMP) WITH MICROSCOPIC
Bacteria, UA: NONE SEEN
Bilirubin Urine: NEGATIVE
Glucose, UA: NEGATIVE mg/dL
Hgb urine dipstick: NEGATIVE
Ketones, ur: NEGATIVE mg/dL
Leukocytes,Ua: NEGATIVE
Nitrite: NEGATIVE
Protein, ur: NEGATIVE mg/dL
Specific Gravity, Urine: 1.006 (ref 1.005–1.030)
pH: 6 (ref 5.0–8.0)

## 2018-09-16 LAB — COMPREHENSIVE METABOLIC PANEL
ALT: 25 U/L (ref 0–44)
AST: 29 U/L (ref 15–41)
Albumin: 3.3 g/dL — ABNORMAL LOW (ref 3.5–5.0)
Alkaline Phosphatase: 54 U/L (ref 38–126)
Anion gap: 12 (ref 5–15)
BUN: 9 mg/dL (ref 6–20)
CO2: 21 mmol/L — ABNORMAL LOW (ref 22–32)
Calcium: 8.4 mg/dL — ABNORMAL LOW (ref 8.9–10.3)
Chloride: 108 mmol/L (ref 98–111)
Creatinine, Ser: 1.18 mg/dL (ref 0.61–1.24)
GFR calc Af Amer: >60 mL/min (ref >60)
GFR calc non Af Amer: >60 mL/min (ref >60)
Glucose, Bld: 78 mg/dL (ref 70–99)
Potassium: 3.6 mmol/L (ref 3.5–5.1)
Sodium: 141 mmol/L (ref 135–145)
Total Bilirubin: 0.6 mg/dL (ref 0.3–1.2)
Total Protein: 5.7 g/dL — ABNORMAL LOW (ref 6.5–8.1)

## 2018-09-16 LAB — CBC WITH DIFFERENTIAL/PLATELET
Abs Immature Granulocytes: 0.02 10*3/uL (ref 0.00–0.07)
Basophils Absolute: 0 10*3/uL (ref 0.0–0.1)
Basophils Relative: 0 %
Eosinophils Absolute: 0 10*3/uL (ref 0.0–0.5)
Eosinophils Relative: 0 %
HCT: 42.2 % (ref 39.0–52.0)
Hemoglobin: 13.4 g/dL (ref 13.0–17.0)
Immature Granulocytes: 0 %
Lymphocytes Relative: 26 %
Lymphs Abs: 2.1 10*3/uL (ref 0.7–4.0)
MCH: 27.4 pg (ref 26.0–34.0)
MCHC: 31.8 g/dL (ref 30.0–36.0)
MCV: 86.3 fL (ref 80.0–100.0)
Monocytes Absolute: 0.6 10*3/uL (ref 0.1–1.0)
Monocytes Relative: 8 %
Neutro Abs: 5.3 10*3/uL (ref 1.7–7.7)
Neutrophils Relative %: 66 %
Platelets: 237 10*3/uL (ref 150–400)
RBC: 4.89 MIL/uL (ref 4.22–5.81)
RDW: 15.4 % (ref 11.5–15.5)
WBC: 7.9 10*3/uL (ref 4.0–10.5)
nRBC: 0 % (ref 0.0–0.2)

## 2018-09-16 LAB — ETHANOL: Alcohol, Ethyl (B): 138 mg/dL — ABNORMAL HIGH (ref <10)

## 2018-09-16 MED ORDER — NALOXONE HCL 0.4 MG/ML IJ SOLN
0.4000 mg | INTRAMUSCULAR | Status: DC | PRN
  Filled 2018-09-16: qty 1

## 2018-09-16 MED ORDER — LEVETIRACETAM IN NACL 1000 MG/100ML IV SOLN
1000.0000 mg | Freq: Once | INTRAVENOUS | Status: AC
  Administered 2018-09-16: 1000 mg via INTRAVENOUS
  Filled 2018-09-16: qty 100

## 2018-09-16 MED ORDER — LEVETIRACETAM 500 MG PO TABS: 500.0000 mg | ORAL_TABLET | Freq: Two times a day (BID) | ORAL | 0 refills | Status: DC

## 2018-09-16 MED ORDER — SODIUM CHLORIDE 0.9 % IV BOLUS
1000.0000 mL | Freq: Once | INTRAVENOUS | Status: AC
  Administered 2018-09-16: 20:00:00 1000 mL via INTRAVENOUS

## 2018-09-16 MED ORDER — NALOXONE HCL 0.4 MG/ML IJ SOLN
0.4000 mg | Freq: Once | INTRAMUSCULAR | Status: AC
  Administered 2018-09-16: 20:00:00 0.4 mg via INTRAVENOUS

## 2018-09-16 NOTE — ED Notes (Signed)
Patient is resting comfortably, respirations even and unlabored.

## 2018-09-16 NOTE — ED Triage Notes (Signed)
Pt from home, witnessed seizure by family with hx fo same. EMS arrived and pt was unresponsive on the floor, pinpoint pupils, track marks noted to [facility]. Pt bagged for a few seconds, 2MG  Narcan IN given, pt became alert for 2 mins then went unresponsive again but was breathing. Pt arrives to ED alert on 15L NRB, BP 132 palp, HR 106 CBG 64

## 2018-09-16 NOTE — ED Provider Notes (Signed)
[facility] EMERGENCY [facility]MENT Provider Note   CSN: [id number] Arrival date & time: 09/16/18  1851    History   Chief Complaint Chief Complaint  Patient presents with   Drug Overdose    HPI Alejandro Elliott is a 28 y.o. male.  with a past medical history of seizures on Keppra, substance abuse, asthma who presented to the emergency department today from home by EMS after he was apparently found unresponsive on the floor with pinpoint pupils and bradypnea, patient was briefly bagged by EMS and given 2 mg of Narcan intranasal.      The history is provided by the patient, medical records and the EMS personnel.  Drug Overdose Pertinent negatives include no chest pain, no abdominal pain and no shortness of breath.  Altered Mental Status Presenting symptoms: confusion, disorientation and unresponsiveness   Severity:  Moderate Most recent episode:  Today Episode history:  Single Timing:  Constant Progression:  Improving Chronicity:  New Context: alcohol use and not taking medications as prescribed   Context: not head injury, not recent illness and not recent infection   Associated symptoms: seizures   Associated symptoms: no abdominal pain, no difficulty breathing, no fever, no palpitations, no rash, no vomiting and no weakness     Past Medical History:  Diagnosis Date   Asthma    Cyst near tailbone    Seizures [facility](HCC)     Patient Active Problem List   Diagnosis Date Noted   Laceration of extensor tendon of left forearm 07/17/2018    Past Surgical History:  Procedure Laterality Date   INCISION AND DRAINAGE OF WOUND Left 07/17/2018   Procedure: WASHOUT AND REPAIR OF NECK LACERATIONS LEFT;  Surgeon: Bradly Bienenstockrtmann, Fred, MD;  Location: [facility];  Service: Orthopedics;  Laterality: Left;   LACERATION REPAIR Left 07/17/2018   Procedure: LEFT FOREARM REPAIR OF TENDON LACERATIONS AND OTHER NECESSARY PROCEDURE.;  Surgeon: Bradly Bienenstockrtmann, Fred, MD;  Location: [facility] OR;  Service:  Orthopedics;  Laterality: Left;   MANDIBLE FRACTURE SURGERY          Home Medications    Prior to Admission medications   Medication Sig Start Date End Date Taking? Authorizing Provider  acetaminophen (TYLENOL) 500 MG tablet Take 1,000 mg by mouth every 6 (six) hours as needed for moderate pain.    [provider]  albuterol (PROVENTIL HFA;VENTOLIN HFA) 108 (90 BASE) MCG/ACT inhaler Inhale 2 puffs into the lungs every 6 (six) hours as needed for wheezing or shortness of breath.     [provider]  levETIRAcetam (KEPPRA) 500 MG tablet Take 1 tablet (500 mg total) by mouth 2 (two) times daily. 09/16/18   Saverio DankerWilliams, Mariaeduarda Defranco A, MD  naproxen (NAPROSYN) 500 MG tablet Take 1 tablet twice daily as needed for pain. 09/03/18   Molpus, John, MD  oxyCODONE (OXY IR/ROXICODONE) 5 MG immediate release tablet Take 1 tablet (5 mg total) by mouth every 6 (six) hours as needed for severe pain. 07/18/18   Luretha MurphyMartin, Matthew, MD    Family History Family History  Problem Relation Age of Onset   Seizures Sister     Social History Social History   Tobacco Use   Smoking status: Never Smoker   Smokeless tobacco: Never Used  Substance Use Topics   Alcohol use: Yes   Drug use: Yes    Types: Cocaine, Marijuana     Allergies   Patient has no known allergies.   Review of Systems Review of Systems  Constitutional: Negative for  chills, fatigue and fever.  HENT: Negative.  Negative for ear pain and sore throat.   Eyes: Negative for pain and visual disturbance.  Respiratory: Negative for cough and shortness of breath.   Cardiovascular: Negative for chest pain and palpitations.  Gastrointestinal: Negative for abdominal pain and vomiting.  Genitourinary: Negative for dysuria and hematuria.  Musculoskeletal: Negative for arthralgias and back pain.  Skin: Negative for color change and rash.  Neurological: Positive for seizures. Negative for syncope and weakness.  Psychiatric/Behavioral:  Positive for confusion and decreased concentration.  All other systems reviewed and are negative.    Physical Exam Updated Vital Signs BP 107/73    Pulse 82    Temp 98.4 F (36.9 C) (Oral)    Resp 19    SpO2 99%   Physical Exam Vitals signs and nursing note reviewed.  Constitutional:      Appearance: Normal appearance. He is normal weight.  HENT:     Head: Normocephalic and atraumatic.     Right Ear: External ear normal.     Left Ear: External ear normal.     Nose: Nose normal.     Mouth/Throat:     Mouth: Mucous membranes are moist.  Eyes:     General: Lids are normal.     Conjunctiva/sclera:     Right eye: Right conjunctiva is injected.     Left eye: Left conjunctiva is injected.     Pupils: Pupils are equal, round, and reactive to light.     Right eye: Pupil is sluggish.     Left eye: Pupil is sluggish.     Comments: 75mm  Neck:     Musculoskeletal: Neck supple.  Cardiovascular:     Rate and Rhythm: Regular rhythm. Tachycardia present.     Pulses: Normal pulses.     Heart sounds: Normal heart sounds.  Pulmonary:     Effort: Bradypnea present.     Breath sounds: Decreased air movement present.  Musculoskeletal: Normal range of motion.  Neurological:     Mental Status: He is alert.      ED Treatments / Results  Labs (all labs ordered are listed, but only abnormal results are displayed) Labs Reviewed  COMPREHENSIVE METABOLIC PANEL - Abnormal; Notable for the following components:      Result Value   CO2 21 (*)    Calcium 8.4 (*)    Total Protein 5.7 (*)    Albumin 3.3 (*)    All other components within normal limits  URINALYSIS, COMPLETE (UACMP) WITH MICROSCOPIC - Abnormal; Notable for the following components:   Color, Urine STRAW (*)    All other components within normal limits  RAPID URINE DRUG SCREEN, HOSP PERFORMED - Abnormal; Notable for the following components:   Cocaine POSITIVE (*)    Tetrahydrocannabinol POSITIVE (*)    All other components  within normal limits  ETHANOL - Abnormal; Notable for the following components:   Alcohol, Ethyl (B) 138 (*)    All other components within normal limits  CBC WITH DIFFERENTIAL/PLATELET  CBG MONITORING, ED    EKG EKG Interpretation  Date/Time:  Thursday September 16 2018 19:33:31 EDT Ventricular Rate:  90 PR Interval:    QRS Duration: 97 QT Interval:  364 QTC Calculation: 446 R Axis:   55 Text Interpretation:  Sinus rhythm Borderline T wave abnormalities Abnormal ECG Confirmed by Carmin Muskrat [phone]) on 09/16/2018 7:39:35 PM Also confirmed by Carmin Muskrat ([id number]), editor Philomena Doheny [phone])  on 09/17/2018 7:27:15 AM  Radiology Ct Head Wo Contrast  Result Date: 09/16/2018 CLINICAL DATA:  Seizure. EXAM: CT HEAD WITHOUT CONTRAST TECHNIQUE: Contiguous axial images were obtained from the base of the skull through the vertex without intravenous contrast. COMPARISON:  CT scan of June 07, 2018. FINDINGS: Brain: No evidence of acute infarction, hemorrhage, hydrocephalus, extra-axial collection or mass lesion/mass effect. Vascular: No hyperdense vessel or unexpected calcification. Skull: Normal. Negative for fracture or focal lesion. Sinuses/Orbits: No acute finding. Other: None. IMPRESSION: Normal head CT. Electronically Signed   By: Lupita RaiderJames  Green Jr M.D.   On: 09/16/2018 20:12    Procedures Procedures (including critical care time)  Medications Ordered in ED Medications  levETIRAcetam (KEPPRA) IVPB 1000 mg/100 mL premix (0 mg Intravenous Stopped 09/16/18 2003)  sodium chloride 0.9 % bolus 1,000 mL (0 mLs Intravenous Stopped 09/16/18 2327)  naloxone (NARCAN) injection 0.4 mg (0.4 mg Intravenous Given 09/16/18 1941)     Initial Impression / Assessment and Plan / ED Course  I have reviewed the triage vital signs and the nursing notes.  Pertinent labs & imaging results that were available during my care of the patient were reviewed by me and considered in my medical decision making (see chart  for details).  Clinical Course as of Sep 16 1300  Thu Sep 16, 2018  2244 Normal CT  CT HEAD WO CONTRAST [DW]    Clinical Course User Index [DW] Saverio DankerWilliams, Trigger Frasier A, MD        Medical Decision Making:  Alejandro Elliott is a 28 year old male who presented to the ED today by EMS from home after he was found unresponsive by family and reportedly having tonic-clonic-like movements concerning for a seizure. EMS found him with very shallow respirations and pinpoint pupils and administered 2mg  of intranasal narcan with improvement mentation and airway stabilization.   Past medical history significant for seizure on keppra 500mg  bid, polysubstance abuse Reviewed and confirmed nursing documentation for past medical history, family history, social history.  On my initial exam, the pt was sluggish to respond but awake, and following commands.  DDx: postictal s/p seizure, opiate overdose, polysubstance overdose   I do not think that the patient has a serious Infection, Sepsis, Meningitis, Encephalitis, Traumatic Injury, or Hypo/Hyperglycemic emergency. Given the patient's history of present illness and physical exam, I suspect the combination of acute intoxication and possible seizure.  He was given 1 additional dose of 0.4 mg of Narcan IV shortly after arrival.  His laboratory work-up results have been reviewed and are significant for an elevated serum ethanol of 138, positive cocaine and THC on his urine drug screen, otherwise unremarkable CBC, CMP, normal CT head and EKG findings.  He was closely monitored and reassessed in the emergency department while he metabolized from his acute intoxication.  At the time of discharge, the patient was no longer clinically intoxicated, he denied SI, HI and he did express that he ran out of his Keppra medication about 2 weeks ago.  At this time he is calm, cooperative, conversant and follows all commands appropriately with a GCS of 15.  He was not tachycardic or  hypotensive and afebrile.  He had no further evidence of declining mentation or respiratory difficulty.  His Keppra prescription was refilled for a 1 month supply and he was advised to follow-up with his primary care physician at the earliest next available ED follow-up visit.   All radiology and laboratory studies reviewed independently and with my attending physician, agree with reading provided by  radiologist unless otherwise noted.   The above care was discussed with and agreed upon by my attending physician, Dr. Gerhard Munchobert Lockwood.   Final Clinical Impressions(s) / ED Diagnoses   Final diagnoses:  Drug overdose, undetermined intent, initial encounter  Seizure (HCC)  Alcohol intoxication with delirium [facility](HCC)    Disposition: discharge to home/self  ED Discharge Orders         Ordered    levETIRAcetam (KEPPRA) 500 MG tablet  2 times daily     09/16/18 2352           Saverio DankerWilliams, Vasco Chong A, MD 09/17/18 1313    Gerhard MunchLockwood, Robert, MD 09/20/18 [phone]

## 2018-09-16 NOTE — ED Notes (Signed)
Pt awake and sitting; contacting family for ride home.

## 2018-09-16 NOTE — ED Notes (Signed)
Pt oxygen saturations have remained 100%. Turned [place] off at this time. Will cont to monitor.

## 2018-09-16 NOTE — ED Notes (Signed)
Called wife per request of patient. Voicemail left.

## 2018-09-16 NOTE — ED Notes (Signed)
Pt sister Lattie Haw [phone]; will pick up the patient shortly.

## 2018-09-21 ENCOUNTER — Ambulatory Visit: Payer: No Typology Code available for payment source | Attending: Orthopedic Surgery | Admitting: Occupational Therapy

## 2018-11-26 ENCOUNTER — Other Ambulatory Visit: Payer: Self-pay

## 2018-11-26 ENCOUNTER — Emergency Department (HOSPITAL_COMMUNITY)
Admission: EM | Admit: 2018-11-26 | Discharge: 2018-11-26 | Disposition: A | Discharge status: Home / Self Care | Payer: Self-pay | Attending: Emergency Medicine | Admitting: Emergency Medicine

## 2018-11-26 DIAGNOSIS — J45909 Unspecified asthma, uncomplicated: Secondary | ICD-10-CM | POA: Insufficient documentation

## 2018-11-26 DIAGNOSIS — Z79899 Other long term (current) drug therapy: Secondary | ICD-10-CM | POA: Insufficient documentation

## 2018-11-26 DIAGNOSIS — M778 Other enthesopathies, not elsewhere classified: Secondary | ICD-10-CM | POA: Insufficient documentation

## 2018-11-26 NOTE — Discharge Instructions (Signed)
Follow-up with your orthopedic surgeon as discussed. Return for signs of infection such as spreading redness, significant swelling, fevers or other concerns. Wear splint only as needed at work.  Discuss physical therapy as needed with your bone doctor.

## 2018-11-26 NOTE — Progress Notes (Signed)
Orthopedic Tech Progress Note Patient Details:  Alejandro Elliott 1990/12/03 [id number]  Ortho Devices Type of Ortho Device: Velcro wrist splint Ortho Device/Splint Location: lue Ortho Device/Splint Interventions: Ordered, Application, Adjustment   Post Interventions Patient Tolerated: Well Instructions Provided: Care of device, Adjustment of device   [name] 11/26/2018, 6:24 PM

## 2018-11-26 NOTE — ED Triage Notes (Signed)
Pt here for evaluation of L arm pain. Pt had surgery four months ago to repair tendons in his L arm. Sts he is supposed to be wearing a brace but he lost it. Two days ago pt began having pain in this arm again. Denies injury.

## 2018-12-07 NOTE — ED Provider Notes (Signed)
[facility] EMERGENCY DEPARTMENT Provider Note   CSN: [id number] Arrival date & time: 11/26/18  1650     History   Chief Complaint Chief Complaint  Patient presents with  . Arm Pain    HPI Alejandro Elliott is a 28 y.o. male.     Patient presents with intermittent discomfort of left wrist and forearm.  Patient had surgery for surgery repair 4 months ago.  Patient's had no acute issues since then.  Patient denies fevers or infectious symptoms.  No drainage.  Patient has some weakness loss of tendon function since injury/surgery.  Patient did lose his wrist splint and needs it for support.     Past Medical History:  Diagnosis Date  . Asthma   . Cyst near tailbone   . Seizures [facility])     Patient Active Problem List   Diagnosis Date Noted  . Laceration of extensor tendon of left forearm 07/17/2018    Past Surgical History:  Procedure Laterality Date  . INCISION AND DRAINAGE OF WOUND Left 07/17/2018   Procedure: WASHOUT AND REPAIR OF NECK LACERATIONS LEFT;  Surgeon: Iran Planas, MD;  Location: [facility];  Service: Orthopedics;  Laterality: Left;  . LACERATION REPAIR Left 07/17/2018   Procedure: LEFT FOREARM REPAIR OF TENDON LACERATIONS AND OTHER NECESSARY PROCEDURE.;  Surgeon: Iran Planas, MD;  Location: [facility];  Service: Orthopedics;  Laterality: Left;  Marland Kitchen MANDIBLE FRACTURE SURGERY          Home Medications    Prior to Admission medications   Medication Sig Start Date End Date Taking? Authorizing Provider  acetaminophen (TYLENOL) 500 MG tablet Take 1,000 mg by mouth every 6 (six) hours as needed for moderate pain.    [provider]  albuterol (PROVENTIL HFA;VENTOLIN HFA) 108 (90 BASE) MCG/ACT inhaler Inhale 2 puffs into the lungs every 6 (six) hours as needed for wheezing or shortness of breath.     [provider]  levETIRAcetam (KEPPRA) 500 MG tablet Take 1 tablet (500 mg total) by mouth 2 (two) times daily. 09/16/18   Jefm Petty, MD   naproxen (NAPROSYN) 500 MG tablet Take 1 tablet twice daily as needed for pain. 09/03/18   Molpus, John, MD  oxyCODONE (OXY IR/ROXICODONE) 5 MG immediate release tablet Take 1 tablet (5 mg total) by mouth every 6 (six) hours as needed for severe pain. 07/18/18   Johnathan Hausen, MD    Family History Family History  Problem Relation Age of Onset  . Seizures Sister     Social History Social History   Tobacco Use  . Smoking status: Never Smoker  . Smokeless tobacco: Never Used  Substance Use Topics  . Alcohol use: Yes  . Drug use: Yes    Types: Cocaine, Marijuana     Allergies   Patient has no known allergies.   Review of Systems Review of Systems  Constitutional: Negative for chills and fever.  HENT: Negative for congestion.   Respiratory: Negative for shortness of breath.   Cardiovascular: Negative for chest pain.  Gastrointestinal: Negative for abdominal pain and vomiting.  Musculoskeletal: Negative for back pain, neck pain and neck stiffness.  Skin: Negative for rash.  Neurological: Negative for light-headedness and headaches.     Physical Exam Updated Vital Signs BP 125/72 (BP Location: Right Arm)   Pulse 71   Temp 98.3 F (36.8 C) (Oral)   Resp 16   SpO2 99%   Physical Exam Vitals signs and nursing note reviewed.  Constitutional:  Appearance: He is well-developed.  HENT:     Head: Normocephalic and atraumatic.  Eyes:     General:        Right eye: No discharge.        Left eye: No discharge.  Neck:     Trachea: No tracheal deviation.  Cardiovascular:     Rate and Rhythm: Normal rate.  Pulmonary:     Effort: Pulmonary effort is normal.  Musculoskeletal:     Comments: Patient has minimal tenderness over healed surgical wounds.  No sign of active infection.  Patient has range of motion extension and flexion fingers and wrist with mild weakness with extension which he says is the same since surgery.  Skin:    General: Skin is warm.     Findings:  No rash.  Neurological:     Mental Status: He is alert and oriented to person, place, and time.      ED Treatments / Results  Labs (all labs ordered are listed, but only abnormal results are displayed) Labs Reviewed - No data to display  EKG None  Radiology No results found.  Procedures Procedures (including critical care time)  Medications Ordered in ED Medications - No data to display   Initial Impression / Assessment and Plan / ED Course  I have reviewed the triage vital signs and the nursing notes.  Pertinent labs & imaging results that were available during my care of the patient were reviewed by me and considered in my medical decision making (see chart for details).       Patient presents with tendinitis and needing splint.  Velcro splint ordered and given.  No indication for further work-up at this time.  No new injuries.  Follow-up with his orthopedic discussed.  Final Clinical Impressions(s) / ED Diagnoses   Final diagnoses:  Left wrist tendonitis    ED Discharge Orders    None       Blane Ohara, MD 12/07/18 1625

## 2019-01-11 NOTE — Therapy (Signed)
[facility] [address] [place], [place], [address] Phone: [phone]   Fax:  [phone]  Patient Details  Name: QUINTERRIUS ERRINGTON MRN: [id number] Date of Birth: April 22, 1990 Referring Provider:  No ref. provider found  Encounter Date: 01/11/2019  OCCUPATIONAL THERAPY DISCHARGE SUMMARY  Visits from Start of Care: 4  Current functional level related to goals / functional outcomes: OT Short Term Goals - 08/31/18 1452      OT SHORT TERM GOAL #1   Title  Independent with splint wear and care    Time  4    Period  Weeks    Status  Achieved      OT SHORT TERM GOAL #2   Title  Independent with A/ROM HEP     Time  4    Period  Weeks    Status  Achieved      OT SHORT TERM GOAL #3   Title  Pt to have 90% or greater full composite flexion and extension Lt hand    Time  4    Period  Weeks    Status  Achieved      OT SHORT TERM GOAL #4   Title  Pt to demo wrist ROM WFL's to perform functional tasks    Time  4    Period  Weeks    Status  Achieved      OT Long Term Goals - 09/14/18 1245      OT LONG TERM GOAL #1   Title  Independent with updated HEP     Time  8    Period  Weeks    Status  Achieved      OT LONG TERM GOAL #2   Title  Grip strength Lt hand to be 50% or greater compared to that of Rt hand    Time  8    Period  Weeks    Status    09/08/18: RT = 91 lbs, LT = 25 lbs,  09/14/18: Lt = 44 lbs     OT LONG TERM GOAL #3   Title  Pt to return to using Lt hand as non dominant hand for all bilateral tasks/IADLS    Time  8    Period  Weeks    Status  UNKNOWN     OT LONG TERM GOAL #4   Title  Pain less than or equal to 4/10 for functional tasks    Time  8    Period  Weeks    Status  Achieved         Remaining deficits: UNKNOWN as pt did not return after 4th visit on 09/14/18   Education / Equipment: HEP, splint wear and care  Plan: Patient agrees to discharge.  Patient goals were partially met.  Patient is being discharged due to not returning since the last visit.  ?????        Carey Bullocks, OTR/L 01/11/2019, 9:23 AM  [facility] [address] [place], [place], [address] Phone: [phone]   Fax:  [phone]

## 2019-02-01 ENCOUNTER — Ambulatory Visit (HOSPITAL_COMMUNITY)
Admission: EM | Admit: 2019-02-01 | Discharge: 2019-02-01 | Disposition: A | Discharge status: Home / Self Care | Payer: Self-pay | Attending: Family Medicine | Admitting: Family Medicine

## 2019-02-01 ENCOUNTER — Encounter (HOSPITAL_COMMUNITY): Payer: Self-pay

## 2019-02-01 DIAGNOSIS — L0501 Pilonidal cyst with abscess: Secondary | ICD-10-CM

## 2019-02-01 MED ORDER — HYDROCODONE-ACETAMINOPHEN 5-325 MG PO TABS: 1.0000 | ORAL_TABLET | Freq: Four times a day (QID) | ORAL | 0 refills | Status: DC | PRN

## 2019-02-01 MED ORDER — DOXYCYCLINE HYCLATE 100 MG PO CAPS: 100.0000 mg | ORAL_CAPSULE | Freq: Two times a day (BID) | ORAL | 0 refills | Status: DC

## 2019-02-01 MED ORDER — LIDOCAINE HCL 2 % IJ SOLN
INTRAMUSCULAR | Status: AC
  Filled 2019-02-01: qty 20

## 2019-02-01 NOTE — ED Provider Notes (Addendum)
[name]   [id number] 02/01/19 Arrival Time: 3295  ASSESSMENT & PLAN:  1. Pilonidal abscess     Incision and Drainage Procedure Note  Anesthesia: 2% plain lidocaine  Procedure Details  The procedure, risks and complications have been discussed in detail (including, but not limited to pain and bleeding) with the patient.  The skin induration was prepped and draped in the usual fashion. After adequate local anesthesia, I&D with a #11 blade was performed on the midline superior gluteal cleft with copious purulent drainage. Bleeding controlled.  EBL: minimal Drains: none Packing: 1/4 in iodoform gauze Condition: Tolerated procedure well Complications: none.  Begin: Meds ordered this encounter  Medications  . HYDROcodone-acetaminophen (NORCO/VICODIN) 5-325 MG tablet    Sig: Take 1 tablet by mouth every 6 (six) hours as needed for moderate pain or severe pain.    Dispense:  8 tablet    Refill:  0  . doxycycline (VIBRAMYCIN) 100 MG capsule    Sig: Take 1 capsule (100 mg total) by mouth 2 (two) times daily.    Dispense:  20 capsule    Refill:  0    Wound care instructions discussed and given in written format. To return in 48 hours for wound check and packing removal, sooner if needed.  Finish all antibiotics.  [place] Controlled Substances Registry consulted for this patient. I feel the risk/benefit ratio today is favorable for proceeding with this prescription for a controlled substance. Medication sedation precautions given.  Reviewed expectations re: course of current medical issues. Questions answered. Outlined signs and symptoms indicating need for more acute intervention. Patient verbalized understanding. After Visit Summary given.   SUBJECTIVE:  Alejandro Elliott is a 28 y.o. male who presents with a possible infection of his upper buttock. Onset gradual, approximately several days ago without active drainage and without active bleeding. Symptoms have  gradually worsened since beginning. Fever: absent. OTC/home treatment: Tylenol without help. Reports distant h/o similar.  ROS: As per HPI.  OBJECTIVE:  Vitals:   02/01/19 1708  BP: 127/66  Pulse: 87  Resp: 17  Temp: 98.3 F (36.8 C)  TempSrc: Oral  SpO2: 98%     General appearance: alert; no distress Skin: approx 2x3 cm induration of his superior gluteal cleft; very tender to touch; no active drainage or bleeding; overlying erythema Psychological: alert and cooperative; normal mood and affect  No Known Allergies  Past Medical History:  Diagnosis Date  . Asthma   . Cyst near tailbone   . Seizures ([place])    Social History   Socioeconomic History  . Marital status: Single    Spouse name: Not on file  . Number of children: Not on file  . Years of education: Not on file  . Highest education level: Not on file  Occupational History  . Not on file  Social Needs  . Financial resource strain: Not on file  . Food insecurity    Worry: Not on file    Inability: Not on file  . Transportation needs    Medical: Not on file    Non-medical: Not on file  Tobacco Use  . Smoking status: Never Smoker  . Smokeless tobacco: Never Used  Substance and Sexual Activity  . Alcohol use: Yes  . Drug use: Yes    Types: Cocaine, Marijuana  . Sexual activity: Not on file  Lifestyle  . Physical activity    Days per week: Not on file    Minutes per session: Not on file  .  Stress: Not on file  Relationships  . Social Musician on phone: Not on file    Gets together: Not on file    Attends religious service: Not on file    Active member of club or organization: Not on file    Attends meetings of clubs or organizations: Not on file    Relationship status: Not on file  Other Topics Concern  . Not on file  Social History Narrative   ** Merged History Encounter **       ** Merged History Encounter **       Family History  Problem Relation Age of Onset  . Seizures  Sister    Past Surgical History:  Procedure Laterality Date  . INCISION AND DRAINAGE OF WOUND Left 07/17/2018   Procedure: WASHOUT AND REPAIR OF NECK LACERATIONS LEFT;  Surgeon: Bradly Bienenstock, MD;  Location: [facility] OR;  Service: Orthopedics;  Laterality: Left;  . LACERATION REPAIR Left 07/17/2018   Procedure: LEFT FOREARM REPAIR OF TENDON LACERATIONS AND OTHER NECESSARY PROCEDURE.;  Surgeon: Bradly Bienenstock, MD;  Location: [facility] OR;  Service: Orthopedics;  Laterality: Left;  Marland Kitchen MANDIBLE FRACTURE SURGERY             Mardella Layman, MD 02/02/19 7628    Mardella Layman, MD 02/02/19 [phone]

## 2019-02-01 NOTE — Discharge Instructions (Signed)
Be aware, pain medications may cause drowsiness. Please do not drive, operate heavy machinery or make important decisions while on this medication, it can cloud your judgement.  

## 2019-02-01 NOTE — ED Triage Notes (Signed)
Pt states having and abscess between his buttocks x 4 days.

## 2019-04-08 ENCOUNTER — Encounter (HOSPITAL_COMMUNITY): Payer: Self-pay | Admitting: Emergency Medicine

## 2019-04-08 ENCOUNTER — Other Ambulatory Visit: Payer: Self-pay

## 2019-04-08 ENCOUNTER — Emergency Department (HOSPITAL_COMMUNITY)
Admission: EM | Admit: 2019-04-08 | Discharge: 2019-04-08 | Disposition: A | Discharge status: Home / Self Care | Payer: HRSA Program | Attending: Emergency Medicine | Admitting: Emergency Medicine

## 2019-04-08 DIAGNOSIS — Z20822 Contact with and (suspected) exposure to covid-19: Secondary | ICD-10-CM | POA: Diagnosis present

## 2019-04-08 DIAGNOSIS — J45909 Unspecified asthma, uncomplicated: Secondary | ICD-10-CM | POA: Diagnosis not present

## 2019-04-08 LAB — SARS CORONAVIRUS 2 (TAT 6-24 HRS): SARS Coronavirus 2: NEGATIVE

## 2019-04-08 NOTE — ED Notes (Signed)
Patient verbalizes understanding of discharge instructions. Opportunity for questioning and answers were provided. Armband removed by staff, pt discharged from ED.  

## 2019-04-08 NOTE — ED Provider Notes (Signed)
[facility] EMERGENCY DEPARTMENT Provider Note   CSN: [id number] Arrival date & time: 04/08/19  6967     History No chief complaint on file.   Alejandro Elliott is a 29 y.o. male with a past medical history of childhood asthma presenting to the ED requesting Covid test.  States that he was incarcerated from 03/19/2019 until 04/04/2019.  States that there was an outbreak in the jail and he was told that he tested positive for Covid but was never shown any results and did not have any symptoms.  He was released from jail 4 days ago and has been quarantining in his car away from his family but would like another test to see if he is actually positive.  He denies any chest pain, cough, fever, shortness of breath or history of positive Covid test other than in jail.  HPI     Past Medical History:  Diagnosis Date  . Asthma   . Cyst near tailbone   . Seizures [facility])     Patient Active Problem List   Diagnosis Date Noted  . Laceration of extensor tendon of left forearm 07/17/2018    Past Surgical History:  Procedure Laterality Date  . INCISION AND DRAINAGE OF WOUND Left 07/17/2018   Procedure: WASHOUT AND REPAIR OF NECK LACERATIONS LEFT;  Surgeon: Bradly Bienenstock, MD;  Location: [facility] OR;  Service: Orthopedics;  Laterality: Left;  . LACERATION REPAIR Left 07/17/2018   Procedure: LEFT FOREARM REPAIR OF TENDON LACERATIONS AND OTHER NECESSARY PROCEDURE.;  Surgeon: Bradly Bienenstock, MD;  Location: [facility] OR;  Service: Orthopedics;  Laterality: Left;  Marland Kitchen MANDIBLE FRACTURE SURGERY         Family History  Problem Relation Age of Onset  . Seizures Sister     Social History   Tobacco Use  . Smoking status: Never Smoker  . Smokeless tobacco: Never Used  Substance Use Topics  . Alcohol use: Yes  . Drug use: Yes    Types: Cocaine, Marijuana    Home Medications Prior to Admission medications   Medication Sig Start Date End Date Taking? Authorizing Provider  acetaminophen (TYLENOL) 500  MG tablet Take 1,000 mg by mouth every 6 (six) hours as needed for moderate pain.    [provider]  albuterol (PROVENTIL HFA;VENTOLIN HFA) 108 (90 BASE) MCG/ACT inhaler Inhale 2 puffs into the lungs every 6 (six) hours as needed for wheezing or shortness of breath.     [provider]  doxycycline (VIBRAMYCIN) 100 MG capsule Take 1 capsule (100 mg total) by mouth 2 (two) times daily. 02/01/19   Mardella Layman, MD  HYDROcodone-acetaminophen (NORCO/VICODIN) 5-325 MG tablet Take 1 tablet by mouth every 6 (six) hours as needed for moderate pain or severe pain. 02/01/19   Mardella Layman, MD  levETIRAcetam (KEPPRA) 500 MG tablet Take 1 tablet (500 mg total) by mouth 2 (two) times daily. 09/16/18   Saverio Danker, MD  naproxen (NAPROSYN) 500 MG tablet Take 1 tablet twice daily as needed for pain. 09/03/18   Molpus, John, MD  oxyCODONE (OXY IR/ROXICODONE) 5 MG immediate release tablet Take 1 tablet (5 mg total) by mouth every 6 (six) hours as needed for severe pain. 07/18/18   Luretha Murphy, MD    Allergies    Patient has no known allergies.  Review of Systems   Review of Systems  Constitutional: Negative for chills and fever.  Respiratory: Negative for cough and shortness of breath.   Cardiovascular: Negative for chest pain.  Musculoskeletal: Negative for myalgias.    Physical Exam Updated Vital Signs BP 120/75   Pulse 97   Temp 98.4 F (36.9 C) (Oral)   Resp 16   Ht 6' (1.829 m)   Wt 99.8 kg   SpO2 98%   BMI 29.84 kg/m   Physical Exam Vitals and nursing note reviewed.  Constitutional:      General: He is not in acute distress.    Appearance: He is well-developed. He is not diaphoretic.     Comments: Speaking in complete sentences without signs of respiratory distress.  HENT:     Head: Normocephalic and atraumatic.  Eyes:     General: No scleral icterus.    Conjunctiva/sclera: Conjunctivae normal.  Cardiovascular:     Rate and Rhythm: Normal rate and regular  rhythm.     Heart sounds: Normal heart sounds.  Pulmonary:     Effort: Pulmonary effort is normal. No respiratory distress.     Breath sounds: Normal breath sounds.  Musculoskeletal:     Cervical back: Normal range of motion.  Skin:    Findings: No rash.  Neurological:     Mental Status: He is alert.     ED Results / Procedures / Treatments   Labs (all labs ordered are listed, but only abnormal results are displayed) Labs Reviewed  SARS CORONAVIRUS 2 (TAT 6-24 HRS)    EKG None  Radiology No results found.  Procedures Procedures (including critical care time)  Medications Ordered in ED Medications - No data to display  ED Course  I have reviewed the triage vital signs and the nursing notes.  Pertinent labs & imaging results that were available during my care of the patient were reviewed by me and considered in my medical decision making (see chart for details).    MDM Rules/Calculators/A&P                      Alejandro Elliott was evaluated in Emergency Department on 04/08/19 for the symptoms described in the history of present illness. He/she was evaluated in the context of the global COVID-19 pandemic, which necessitated consideration that the patient might be at risk for infection with the SARS-CoV-2 virus that causes COVID-19. Institutional protocols and algorithms that pertain to the evaluation of patients at risk for COVID-19 are in a state of rapid change based on information released by regulatory bodies including the CDC and federal and state organizations. These policies and algorithms were followed during the patient's care in the ED.  28 year old recently incarcerated male presenting to the ED requesting a Covid test.  Denies any symptoms at this time.  He did have a positive test last week but he is unsure if this is actually true as he was never shown the results.  Will obtain Covid test today and will have patient follow-up with results when it is available.  He  is not hypoxic, tachycardic or tachypneic there are no signs of respiratory distress today.  He was educated on signs and symptoms to return to the ED.  Patient is hemodynamically stable, in NAD, and able to ambulate in the ED. Evaluation does not show pathology that would require ongoing emergent intervention or inpatient treatment. I explained the diagnosis to the patient. Pain has been managed and has no complaints prior to discharge. Patient is comfortable with above plan and is stable for discharge at this time. All questions were answered prior to disposition. Strict return precautions for returning to the  ED were discussed. Encouraged follow up with PCP.   An After Visit Summary was printed and given to the patient.   Portions of this note were generated with [employer]. Dictation errors may occur despite best attempts at proofreading.  Final Clinical Impression(s) / ED Diagnoses Final diagnoses:  Suspected 2019 novel coronavirus infection    Rx / DC Orders ED Discharge Orders    None       Delia Heady, PA-C 04/08/19 1045    Carmin Muskrat, MD 04/10/19 [phone]

## 2019-04-08 NOTE — ED Triage Notes (Signed)
Pt reports being exposed to covid but denies any symptoms. States he was in jail and there was an outbreak of covid. States they told him he tested positive but pt does not believe it due to no symptoms. Wants to know if he is positive.

## 2019-04-08 NOTE — Discharge Instructions (Addendum)
We will contact you with the results of your Covid test when it is available. Return to the ED if you start to experience chest pain, shortness of breath, leg swelling, loss of consciousness.

## 2019-04-11 ENCOUNTER — Telehealth (HOSPITAL_COMMUNITY): Payer: Self-pay

## 2019-04-26 ENCOUNTER — Other Ambulatory Visit: Payer: Self-pay

## 2019-04-26 ENCOUNTER — Emergency Department (HOSPITAL_COMMUNITY)
Admission: EM | Admit: 2019-04-26 | Discharge: 2019-04-26 | Disposition: A | Discharge status: Home / Self Care | Payer: Self-pay | Attending: Emergency Medicine | Admitting: Emergency Medicine

## 2019-04-26 ENCOUNTER — Encounter (HOSPITAL_COMMUNITY): Payer: Self-pay | Admitting: Emergency Medicine

## 2019-04-26 ENCOUNTER — Emergency Department (HOSPITAL_COMMUNITY): Payer: Self-pay

## 2019-04-26 DIAGNOSIS — Z79899 Other long term (current) drug therapy: Secondary | ICD-10-CM | POA: Insufficient documentation

## 2019-04-26 DIAGNOSIS — M79632 Pain in left forearm: Secondary | ICD-10-CM | POA: Insufficient documentation

## 2019-04-26 DIAGNOSIS — Z8709 Personal history of other diseases of the respiratory system: Secondary | ICD-10-CM | POA: Insufficient documentation

## 2019-04-26 DIAGNOSIS — Z8669 Personal history of other diseases of the nervous system and sense organs: Secondary | ICD-10-CM | POA: Insufficient documentation
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
## 2019-04-26 NOTE — Discharge Instructions (Addendum)
Tylenol and Motrin for any pain.  Ice and elevate the area.  Follow-up with your hand surgeon for a recheck.
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
## 2019-07-03 ENCOUNTER — Other Ambulatory Visit: Payer: Self-pay

## 2019-07-03 ENCOUNTER — Emergency Department (HOSPITAL_COMMUNITY)
Admission: EM | Admit: 2019-07-03 | Discharge: 2019-07-03 | Disposition: A | Discharge status: Home / Self Care | Payer: Self-pay | Attending: Emergency Medicine | Admitting: Emergency Medicine

## 2019-07-03 ENCOUNTER — Encounter (HOSPITAL_COMMUNITY): Payer: Self-pay | Admitting: Emergency Medicine

## 2019-07-03 DIAGNOSIS — S39012A Strain of muscle, fascia and tendon of lower back, initial encounter: Secondary | ICD-10-CM | POA: Insufficient documentation

## 2019-07-03 DIAGNOSIS — Y929 Unspecified place or not applicable: Secondary | ICD-10-CM | POA: Insufficient documentation

## 2019-07-03 DIAGNOSIS — Y990 Civilian activity done for income or pay: Secondary | ICD-10-CM | POA: Insufficient documentation

## 2019-07-03 DIAGNOSIS — X500XXA Overexertion from strenuous movement or load, initial encounter: Secondary | ICD-10-CM | POA: Insufficient documentation

## 2019-07-03 DIAGNOSIS — Y939 Activity, unspecified: Secondary | ICD-10-CM | POA: Insufficient documentation

## 2019-07-03 MED ORDER — CYCLOBENZAPRINE HCL 10 MG PO TABS: 5.0000 mg | ORAL_TABLET | Freq: Two times a day (BID) | ORAL | 0 refills | Status: DC | PRN

## 2019-07-03 MED ORDER — KETOROLAC TROMETHAMINE 60 MG/2ML IM SOLN
60.0000 mg | Freq: Once | INTRAMUSCULAR | Status: AC
  Administered 2019-07-03: 60 mg via INTRAMUSCULAR
  Filled 2019-07-03: qty 2

## 2019-07-03 MED ORDER — CELECOXIB 200 MG PO CAPS: 200.0000 mg | ORAL_CAPSULE | Freq: Two times a day (BID) | ORAL | 0 refills | Status: DC | PRN

## 2019-07-03 MED ORDER — DEXAMETHASONE SODIUM PHOSPHATE 10 MG/ML IJ SOLN
10.0000 mg | Freq: Once | INTRAMUSCULAR | Status: AC
  Administered 2019-07-03: 10 mg via INTRAMUSCULAR
  Filled 2019-07-03: qty 1

## 2019-07-03 NOTE — ED Triage Notes (Signed)
Pt c/o R sided lower back pain that started 2 days ago,  1 hr after lifting something heavy at work.

## 2019-07-03 NOTE — Discharge Instructions (Signed)
SEEK IMMEDIATE MEDICAL ATTENTION IF: New numbness, tingling, weakness, or problem with the use of your arms or legs.  Severe back pain not relieved with medications.  Change in bowel or bladder control.  Increasing pain in any areas of the body (such as chest or abdominal pain).  Shortness of breath, dizziness or fainting.  Nausea (feeling sick to your stomach), vomiting, fever, or sweats.  

## 2019-07-03 NOTE — ED Notes (Signed)
Patient verbalizes understanding of discharge instructions . Opportunity for questions and answers were provided . Armband removed by staff ,Pt discharged from ED. W/C  offered at D/C  and Declined W/C at D/C and was escorted to lobby by RN.  

## 2019-07-03 NOTE — ED Provider Notes (Signed)
[facility] EMERGENCY DEPARTMENT Provider Note   CSN: [id number] Arrival date & time: 07/03/19  1310     History Chief Complaint  Patient presents with  . Back Pain    JOHNPAUL GILLENTINE is a 29 y.o. male presents emergency department back pain.  Patient states that he was lifting a heavy table 2 days ago while at work and did not lift it properly.  He states about an hour after doing this he started having pack pain in his right lower back.  Yesterday it was worse.  Last night he had significant difficulty sleeping due to pain in his back and states that he has never had pain like this before.  The pain is localized to the right lower back.  He denies radiation down the leg, weakness or numbness of the lower extremities, saddle anesthesia, bowel incontinence.  It is worse with any movement or change in position, better with rest.  HPI     Past Medical History:  Diagnosis Date  . Asthma   . Cyst near tailbone   . Seizures [facility])     Patient Active Problem List   Diagnosis Date Noted  . Laceration of extensor tendon of left forearm 07/17/2018    Past Surgical History:  Procedure Laterality Date  . INCISION AND DRAINAGE OF WOUND Left 07/17/2018   Procedure: WASHOUT AND REPAIR OF NECK LACERATIONS LEFT;  Surgeon: Iran Planas, MD;  Location: [facility];  Service: Orthopedics;  Laterality: Left;  . LACERATION REPAIR Left 07/17/2018   Procedure: LEFT FOREARM REPAIR OF TENDON LACERATIONS AND OTHER NECESSARY PROCEDURE.;  Surgeon: Iran Planas, MD;  Location: [facility];  Service: Orthopedics;  Laterality: Left;  Marland Kitchen MANDIBLE FRACTURE SURGERY         Family History  Problem Relation Age of Onset  . Seizures Sister     Social History   Tobacco Use  . Smoking status: Never Smoker  . Smokeless tobacco: Never Used  Substance Use Topics  . Alcohol use: Yes  . Drug use: Yes    Types: Cocaine, Marijuana    Home Medications Prior to Admission medications   Medication Sig  Start Date End Date Taking? Authorizing Provider  celecoxib (CELEBREX) 200 MG capsule Take 1 capsule (200 mg total) by mouth 2 (two) times daily as needed (for pain). 07/03/19   Margarita Mail, PA-C  cyclobenzaprine (FLEXERIL) 10 MG tablet Take 0.5-1 tablets (5-10 mg total) by mouth 2 (two) times daily as needed for muscle spasms. 07/03/19   Margarita Mail, PA-C  doxycycline (VIBRAMYCIN) 100 MG capsule Take 1 capsule (100 mg total) by mouth 2 (two) times daily. Patient not taking: Reported on 04/26/2019 02/01/19   Vanessa Kick, MD  HYDROcodone-acetaminophen (NORCO/VICODIN) 5-325 MG tablet Take 1 tablet by mouth every 6 (six) hours as needed for moderate pain or severe pain. Patient not taking: Reported on 04/26/2019 02/01/19   Vanessa Kick, MD  levETIRAcetam (KEPPRA) 500 MG tablet Take 1 tablet (500 mg total) by mouth 2 (two) times daily. Patient not taking: Reported on 04/26/2019 09/16/18   Jefm Petty, MD  naproxen (NAPROSYN) 500 MG tablet Take 1 tablet twice daily as needed for pain. Patient not taking: Reported on 04/26/2019 09/03/18   Molpus, Jenny Reichmann, MD  oxyCODONE (OXY IR/ROXICODONE) 5 MG immediate release tablet Take 1 tablet (5 mg total) by mouth every 6 (six) hours as needed for severe pain. Patient not taking: Reported on 04/26/2019 07/18/18   Johnathan Hausen, MD    Allergies  Patient has no known allergies.  Review of Systems   Review of Systems  Genitourinary: Negative.   Musculoskeletal: Positive for back pain.  Neurological: Negative for weakness and numbness.    Physical Exam Updated Vital Signs BP 125/80 (BP Location: Left Arm)   Pulse 75   Temp 98.2 F (36.8 C) (Oral)   Resp 18   Ht 6' (1.829 m)   Wt 95.3 kg   SpO2 98%   BMI 28.48 kg/m   Physical Exam Musculoskeletal:     Comments: Patient appears to be in mild to moderate pain, antalgic gait noted. Lumbosacral spinous process reveals no local tenderness or mass.  Palpation of right lumbar paraspinal region painful and  reduced LS ROM noted. Straight leg raise is negative. DTR's, motor strength and sensation normal, including heel and toe gait.  Peripheral pulses are palpable.      ED Results / Procedures / Treatments   Labs (all labs ordered are listed, but only abnormal results are displayed) Labs Reviewed - No data to display  EKG None  Radiology No results found.  Procedures Procedures (including critical care time)  Medications Ordered in ED Medications  ketorolac (TORADOL) injection 60 mg (has no administration in time range)  dexamethasone (DECADRON) injection 10 mg (has no administration in time range)    ED Course  I have reviewed the triage vital signs and the nursing notes.  Pertinent labs & imaging results that were available during my care of the patient were reviewed by me and considered in my medical decision making (see chart for details).    MDM Rules/Calculators/A&P                        Patient with back pain.  No neurological deficits and normal neuro exam.  Patient can walk but states is painful.  No loss of bowel or bladder control.  No concern for cauda equina.  No fever, night sweats, weight loss, h/o cancer, IVDU.  RICE protocol and pain medicine indicated and discussed with patient.   Final Clinical Impression(s) / ED Diagnoses Final diagnoses:  Strain of lumbar region, initial encounter    Rx / DC Orders ED Discharge Orders         Ordered    celecoxib (CELEBREX) 200 MG capsule  2 times daily PRN     07/03/19 1622    cyclobenzaprine (FLEXERIL) 10 MG tablet  2 times daily PRN     07/03/19 1622           Arthor Captain, PA-C 07/03/19 1627    Tegeler, Canary Brim, MD 07/04/19 [phone]

## 2019-09-24 ENCOUNTER — Emergency Department (HOSPITAL_COMMUNITY)
Admission: EM | Admit: 2019-09-24 | Discharge: 2019-09-24 | Disposition: A | Discharge status: Home / Self Care | Payer: Self-pay | Attending: Emergency Medicine | Admitting: Emergency Medicine

## 2019-09-24 ENCOUNTER — Other Ambulatory Visit: Payer: Self-pay

## 2019-09-24 ENCOUNTER — Encounter (HOSPITAL_COMMUNITY): Payer: Self-pay | Admitting: Emergency Medicine

## 2019-09-24 DIAGNOSIS — F1910 Other psychoactive substance abuse, uncomplicated: Secondary | ICD-10-CM | POA: Insufficient documentation

## 2019-09-24 DIAGNOSIS — Z79899 Other long term (current) drug therapy: Secondary | ICD-10-CM | POA: Insufficient documentation

## 2019-09-24 LAB — CBC WITH DIFFERENTIAL/PLATELET
Abs Immature Granulocytes: 0.01 10*3/uL (ref 0.00–0.07)
Basophils Absolute: 0 10*3/uL (ref 0.0–0.1)
Basophils Relative: 0 %
Eosinophils Absolute: 0 10*3/uL (ref 0.0–0.5)
Eosinophils Relative: 0 %
HCT: 42.3 % (ref 39.0–52.0)
Hemoglobin: 13.5 g/dL (ref 13.0–17.0)
Immature Granulocytes: 0 %
Lymphocytes Relative: 36 %
Lymphs Abs: 1.7 10*3/uL (ref 0.7–4.0)
MCH: 27 pg (ref 26.0–34.0)
MCHC: 31.9 g/dL (ref 30.0–36.0)
MCV: 84.6 fL (ref 80.0–100.0)
Monocytes Absolute: 0.5 10*3/uL (ref 0.1–1.0)
Monocytes Relative: 11 %
Neutro Abs: 2.5 10*3/uL (ref 1.7–7.7)
Neutrophils Relative %: 53 %
Platelets: 241 10*3/uL (ref 150–400)
RBC: 5 MIL/uL (ref 4.22–5.81)
RDW: 15.7 % — ABNORMAL HIGH (ref 11.5–15.5)
WBC: 4.8 10*3/uL (ref 4.0–10.5)
nRBC: 0 % (ref 0.0–0.2)

## 2019-09-24 LAB — COMPREHENSIVE METABOLIC PANEL
ALT: 17 U/L (ref 0–44)
AST: 33 U/L (ref 15–41)
Albumin: 3.5 g/dL (ref 3.5–5.0)
Alkaline Phosphatase: 43 U/L (ref 38–126)
Anion gap: 11 (ref 5–15)
BUN: 6 mg/dL (ref 6–20)
CO2: 22 mmol/L (ref 22–32)
Calcium: 8.5 mg/dL — ABNORMAL LOW (ref 8.9–10.3)
Chloride: 106 mmol/L (ref 98–111)
Creatinine, Ser: 1.24 mg/dL (ref 0.61–1.24)
GFR calc Af Amer: >60 mL/min (ref >60)
GFR calc non Af Amer: >60 mL/min (ref >60)
Glucose, Bld: 88 mg/dL (ref 70–99)
Potassium: 4 mmol/L (ref 3.5–5.1)
Sodium: 139 mmol/L (ref 135–145)
Total Bilirubin: 1.1 mg/dL (ref 0.3–1.2)
Total Protein: 5.5 g/dL — ABNORMAL LOW (ref 6.5–8.1)

## 2019-09-24 LAB — RAPID URINE DRUG SCREEN, HOSP PERFORMED
Amphetamines: NOT DETECTED
Barbiturates: NOT DETECTED
Benzodiazepines: NOT DETECTED
Cocaine: POSITIVE — AB
Opiates: NOT DETECTED
Tetrahydrocannabinol: POSITIVE — AB

## 2019-09-24 LAB — ACETAMINOPHEN LEVEL: Acetaminophen (Tylenol), Serum: <10 ug/mL — ABNORMAL LOW (ref 10–30)

## 2019-09-24 LAB — SALICYLATE LEVEL: Salicylate Lvl: <7 mg/dL — ABNORMAL LOW (ref 7.0–30.0)

## 2019-09-24 LAB — ETHANOL: Alcohol, Ethyl (B): 70 mg/dL — ABNORMAL HIGH (ref <10)

## 2019-09-24 LAB — CBG MONITORING, ED: Glucose-Capillary: 89 mg/dL (ref 70–99)

## 2019-09-24 MED ORDER — ONDANSETRON HCL 4 MG/2ML IJ SOLN
4.0000 mg | Freq: Once | INTRAMUSCULAR | Status: AC
  Administered 2019-09-24: 4 mg via INTRAVENOUS
  Filled 2019-09-24: qty 2

## 2019-09-24 MED ORDER — LACTATED RINGERS IV BOLUS
2000.0000 mL | Freq: Once | INTRAVENOUS | Status: AC
  Administered 2019-09-24: 2000 mL via INTRAVENOUS

## 2019-09-24 NOTE — ED Provider Notes (Signed)
[facility] EMERGENCY DEPARTMENT Provider Note   CSN: [id number] Arrival date & time: 09/24/19  0348     History Chief Complaint  Patient presents with  . Drug Overdose    Alejandro Elliott is a 29 y.o. male.  The history is provided by the patient.  Drug Overdose This is a new problem. The problem has been gradually improving. Pertinent negatives include no chest pain. Nothing aggravates the symptoms. Relieved by: Narcan.  Patient with history of asthma and seizures presents with overdose.  Patient reports he may have partied too hard tonight.  He reports using alcohol/cocaine but denies heroin use.  Patient called EMS himself, but on their arrival he was unresponsive.  They report foam was coming from his mouth.  No seizures reported.  Patient was given Narcan and he began to wake up.      Past Medical History:  Diagnosis Date  . Asthma   . Cyst near tailbone   . Seizures [facility])     Patient Active Problem List   Diagnosis Date Noted  . Laceration of extensor tendon of left forearm 07/17/2018    Past Surgical History:  Procedure Laterality Date  . INCISION AND DRAINAGE OF WOUND Left 07/17/2018   Procedure: WASHOUT AND REPAIR OF NECK LACERATIONS LEFT;  Surgeon: Bradly Bienenstock, MD;  Location: [facility];  Service: Orthopedics;  Laterality: Left;  . LACERATION REPAIR Left 07/17/2018   Procedure: LEFT FOREARM REPAIR OF TENDON LACERATIONS AND OTHER NECESSARY PROCEDURE.;  Surgeon: Bradly Bienenstock, MD;  [facility];  Service: Orthopedics;  Laterality: Left;  Marland Kitchen MANDIBLE FRACTURE SURGERY         Family History  Problem Relation Age of Onset  . Seizures Sister     Social History   Tobacco Use  . Smoking status: Never Smoker  . Smokeless tobacco: Never Used  Vaping Use  . Vaping Use: Never used  Substance Use Topics  . Alcohol use: Yes  . Drug use: Yes    Types: Cocaine, Marijuana    Home Medications Prior to Admission medications   Medication Sig Start  Date End Date Taking? Authorizing Provider  celecoxib (CELEBREX) 200 MG capsule Take 1 capsule (200 mg total) by mouth 2 (two) times daily as needed (for pain). 07/03/19   Arthor Captain, PA-C  cyclobenzaprine (FLEXERIL) 10 MG tablet Take 0.5-1 tablets (5-10 mg total) by mouth 2 (two) times daily as needed for muscle spasms. 07/03/19   Arthor Captain, PA-C  doxycycline (VIBRAMYCIN) 100 MG capsule Take 1 capsule (100 mg total) by mouth 2 (two) times daily. Patient not taking: Reported on 04/26/2019 02/01/19   Mardella Layman, MD  HYDROcodone-acetaminophen (NORCO/VICODIN) 5-325 MG tablet Take 1 tablet by mouth every 6 (six) hours as needed for moderate pain or severe pain. Patient not taking: Reported on 04/26/2019 02/01/19   Mardella Layman, MD  levETIRAcetam (KEPPRA) 500 MG tablet Take 1 tablet (500 mg total) by mouth 2 (two) times daily. Patient not taking: Reported on 04/26/2019 09/16/18   Saverio Danker, MD  naproxen (NAPROSYN) 500 MG tablet Take 1 tablet twice daily as needed for pain. Patient not taking: Reported on 04/26/2019 09/03/18   Molpus, Jonny Ruiz, MD  oxyCODONE (OXY IR/ROXICODONE) 5 MG immediate release tablet Take 1 tablet (5 mg total) by mouth every 6 (six) hours as needed for severe pain. Patient not taking: Reported on 04/26/2019 07/18/18   Luretha Murphy, MD    Allergies    Patient has no known allergies.  Review of Systems   Review of Systems  Constitutional: Negative for fever.  Cardiovascular: Negative for chest pain.  Gastrointestinal: Negative for vomiting.  All other systems reviewed and are negative.   Physical Exam Updated Vital Signs BP 116/77   Pulse 78   Temp (!) 97.2 F (36.2 C) (Oral)   Resp (!) 21   Ht 1.829 m (6')   Wt 90.7 kg   SpO2 98%   BMI 27.12 kg/m   Physical Exam CONSTITUTIONAL: Well developed/well nourished HEAD: Normocephalic/atraumatic EYES: EOMI/PERRL ENMT: Mucous membranes moist, no tongue lacerations NECK: supple no meningeal  signs SPINE/BACK:entire spine nontender CV: S1/S2 noted, no murmurs/rubs/gallops noted LUNGS: Lungs are clear to auscultation bilaterally, no apparent distress ABDOMEN: soft, nontender, no rebound or guarding, bowel sounds noted throughout abdomen GU:no cva tenderness NEURO: Pt is awake/alert/appropriate, moves all extremitiesx4.  No facial droop.  No arm or leg drift EXTREMITIES: pulses normal/equal, full ROM SKIN: warm, color normal PSYCH: no abnormalities of mood noted, alert and oriented to situation  ED Results / Procedures / Treatments   Labs (all labs ordered are listed, but only abnormal results are displayed) Labs Reviewed  CBC WITH DIFFERENTIAL/PLATELET - Abnormal; Notable for the following components:      Result Value   RDW 15.7 (*)    All other components within normal limits  COMPREHENSIVE METABOLIC PANEL - Abnormal; Notable for the following components:   Calcium 8.5 (*)    Total Protein 5.5 (*)    All other components within normal limits  ETHANOL - Abnormal; Notable for the following components:   Alcohol, Ethyl (B) 70 (*)    All other components within normal limits  ACETAMINOPHEN LEVEL - Abnormal; Notable for the following components:   Acetaminophen (Tylenol), Serum <10 (*)    All other components within normal limits  SALICYLATE LEVEL - Abnormal; Notable for the following components:   Salicylate Lvl <7.0 (*)    All other components within normal limits  RAPID URINE DRUG SCREEN, HOSP PERFORMED - Abnormal; Notable for the following components:   Cocaine POSITIVE (*)    Tetrahydrocannabinol POSITIVE (*)    All other components within normal limits  CBG MONITORING, ED    EKG EKG Interpretation  Date/Time:  Saturday September 24 2019 05:43:59 EDT Ventricular Rate:  64 PR Interval:    QRS Duration: 105 QT Interval:  419 QTC Calculation: 433 R Axis:   67 Text Interpretation: Sinus rhythm Probable left ventricular hypertrophy Confirmed by Zadie Rhine  [phone]) on 09/24/2019 5:49:18 AM   Radiology No results found.  Procedures Procedures   Medications Ordered in ED Medications  lactated ringers bolus 2,000 mL (0 mLs Intravenous Stopped 09/24/19 0701)  ondansetron (ZOFRAN) injection 4 mg (4 mg Intravenous Given 09/24/19 0510)    ED Course  I have reviewed the triage vital signs and the nursing notes.  Pertinent labs  results that were available during my care of the patient were reviewed by me and considered in my medical decision making (see chart for details).    MDM Rules/Calculators/A&P                          Patient presented after being found unresponsive.  Patient admitted to utilizing multiple substances for recreational drug abuse.  Patient had received Narcan prior to arrival.  He has been stable in the ER.  He is resting comfortably in no acute distress.  Labs reassuring.  He has ate a meal  without any difficulty.  At this point he is appropriate for discharge home.  Final Clinical Impression(s) / ED Diagnoses Final diagnoses:  Polysubstance abuse [facility])    Rx / DC Orders ED Discharge Orders    None       Zadie Rhine, MD 09/24/19 [phone]

## 2019-09-24 NOTE — ED Notes (Signed)
RN woke pt and gave him a lunch bag per provider.

## 2019-09-24 NOTE — Discharge Instructions (Addendum)
Substance Abuse Treatment Programs ° °Intensive Outpatient Programs °[facility]     °[address]      °[place], [place]                   °[phone]      ° °[facility] °[address] °[place], [place] °[phone] ° °[facility]     °(Inpatient and outpatient)     °[address]           °[phone]   ° °[facility] °[phone] (Suboxone and Methadone) ° °[address]      °[place], [place] [address]      °[phone]      ° °[address] °[place], [place] °[phone] ° °[facility] (Outpatient/Inpatient, [facility])    °(insurance only) [phone]      °       °[facility] (Groups & Residential) °[place] °[phone] ° °   °[facility]     °[address]     °[place], [place]      °[phone]      ° °[facility] (for caregivers and family) °[address] °[place], [place] °336-299-4655 ° ° ° ° ° °Residential Treatment Programs °[facility]      °[address], [place], [place] [address]  °[phone]      ° °[facility] °[address] [place], [place] [address] °[phone] ° °[facility]        °[phone]      ° °[facility] °[phone] ° °[facility]             °[address]                                         °[place], [place]                                                °[phone] or [phone]                              ° °[facility] [place] °[address] °[place], [address] °[phone] ° °[facility]    °[address]      °[place], [place]     °[phone]      ° °[facility] °[address] °[place], [place] °[phone] ° °[facility]   °[address]     °[place], [place] [address]     °[phone]      °Admissions: 8am-3pm M-F ° °[facility] °[address] °[place],  [place] °[phone] ° °[facility]: Residential Program (90 Days)   °[place], [place]      °336-725-8389 or [phone]    ° °[facility] °[place], [place] °(Walk in Hours over the weekend or by referral) ° °[facility] °[address], [place],  [address] °[phone] ° °Crisis Mobile: [facility]:  1-877-626-1772 (for crisis response 24 hours a day) °[facility] Hotline:      1-800-256-2452 °Outpatient Psychiatry and Counseling ° °[facility]: Mobile Crisis   Management 24 hours:  1-877-626-1772 ° °[facility] sliding scale fee and walk in schedule: M-F 8am-12pm/1pm-3pm °[address]  °[place], [place] [address] °[phone] ° °[facility] °[address] °[place], [place] [address] °[phone] ° °[facility] ([facility])- new patient walk-in appointments available Monday - Friday 8am -3pm.          °[address] °[place], [place] [address] °336-676-6840 or crisis line- 336-676-6905 ° °[facility] Intensive Outpatient Therapy Program °[address] °[place], [place] [address] °336-832-9804 ° °[facility]                  °Crisis Services      °[phone]      °[address]     °[place], [place] [address]                ° °[facility]   °[facility] °[phone] °[address] °[place], [place] [address] ° ° °[facility]          °[address]  °[place], [place] [address]       °[phone] ° °[facility] °[address] °[place], [place] [address] °336-292-1510 ° °[facility]    °[address]    °[place], [place] [address]     °[phone]      ° °Parish McKinney, MD     °[address]     °[place] [address]     °336-282-1251     °  °[facility] °[address] °[place] [address] ° °[facility]     °[address]     °[place], [place]      °[phone]      ° °[facility] °Shamsher Ahluwalia, MD °[address] °[place], [place] [address] °[phone] ° °[facility]     °[address]     °[place], [place] [address]     °336-274-1237      ° °[facility] °[address] °[place], [place] [address] °336-854-4450 °Resources for Temporary Residential Assistance/Crisis Centers ° °DAY CENTERS °[facility] °M-F 8am-3pm   °[address] [place], [place] [address]   336-332-0824 °Services include: laundry, barbering, support groups, case management, phone  & computer access, showers, AA/NA mtgs, mental health/substance abuse nurse, job skills class, disability information, VA assistance, spiritual classes, etc.  ° °HOMELESS SHELTERS ° °[facility]     °[facility]   °[address], [place]     °[phone]       °       °[facility] (women and children)       °[address] °, [place] [address] °336-275-0820 °[email or web address] for application and process °Application Required ° °[facility]   °[address]    °[place] [address]     °[phone]       °             °[facility] °[address] °, [place] [address] °[phone] °336-235-0363(schedule application appt.) °Application Required ° °[facility] (women only)    °[address]     °[place], [place] [address]     °336-884-1039      °  Intake starts 6pm daily °Need valid ID, SSC, & Police report °[facility] [place] °[address] °[place], [place] °336-881-5420 °Application Required ° °[facility] (men only)     °[address]      °[place], [place]     °[phone]      ° °[facility] °(Pregnant women only) °[address] °, [place] °336-275-0206 ° °[facility]      °[address]      °[place], [place] [address]     °[phone]      °       °[facility] °[address] °[place], [place] °336-723-1848 °90 day commitment/SA/Application process ° °[facility](men only)     °[address]     °[place], [place]     °[phone]       °Check-in at 7pm     °       °[facility] °[address] °[place], [place] [address] °336-248-6684 °Men/Women/Women and Children must be there by 7 pm ° °[facility] °[place], [place] °336-722-8721                ° °

## 2019-09-24 NOTE — ED Triage Notes (Signed)
According to EMS, pt was on the phone (in his car in his driveway) w/ EMS after using ETOH, cocaine, and unknown narcotic.  EMS found him unresponsive w/ some foam coming from his mouth (hx of seizures).  Was given 1mg  IN and 1mg  IV.  Pt was unresponsive until EMS pulled into ED.    18 L AC Sinus 90 HR 150/84 100% RA CBG 80

## 2019-10-05 ENCOUNTER — Emergency Department (HOSPITAL_COMMUNITY)
Admission: EM | Admit: 2019-10-05 | Discharge: 2019-10-06 | Disposition: A | Discharge status: Home / Self Care | Payer: Self-pay | Attending: Emergency Medicine | Admitting: Emergency Medicine

## 2019-10-05 ENCOUNTER — Other Ambulatory Visit: Payer: Self-pay

## 2019-10-05 DIAGNOSIS — G40909 Epilepsy, unspecified, not intractable, without status epilepticus: Secondary | ICD-10-CM | POA: Insufficient documentation

## 2019-10-05 DIAGNOSIS — J45909 Unspecified asthma, uncomplicated: Secondary | ICD-10-CM | POA: Insufficient documentation

## 2019-10-05 DIAGNOSIS — R569 Unspecified convulsions: Secondary | ICD-10-CM

## 2019-10-05 MED ORDER — LEVETIRACETAM IN NACL 1500 MG/100ML IV SOLN
1500.0000 mg | Freq: Once | INTRAVENOUS | Status: AC
  Administered 2019-10-05: 1500 mg via INTRAVENOUS
  Filled 2019-10-05: qty 100

## 2019-10-05 MED ORDER — LEVETIRACETAM 500 MG PO TABS
500.0000 mg | ORAL_TABLET | Freq: Two times a day (BID) | ORAL | 0 refills | Status: DC
Start: 2019-10-05 — End: 2020-06-05

## 2019-10-05 NOTE — ED Triage Notes (Signed)
Patient from EMS for seizure 2-3 minutes, HX seizures. Became aggressive with EMS and EMS administered versed 5 IM  121/78 89HR CBG 88 99% RA

## 2019-10-05 NOTE — ED Provider Notes (Addendum)
[facility] EMERGENCY DEPARTMENT Provider Note  CSN: [id number] Arrival date & time: 10/05/19 2308  Chief Complaint(s) Seizures  HPI Alejandro Elliott is a 29 y.o. male here for seizure. Per patient's girlfriend, she last spoke with him on the phone 15 min prior to finding unresponsive at his home. When she arrived he was unresponsive but not shaking. When she tried to wake him but slapping his face, he began to shake, which lasted <5 min. EMS was called and when they arrived he was alert, but confused. He became aggressive towards EMS and received versed.   Patient is now alert and Ox3. He denies drug or alcohol use this evening. No recent infections. No trauma. No current complaints.  He has been out of his Keppra for approx 2 months.  HPI  Past Medical History Past Medical History:  Diagnosis Date  . Asthma   . Cyst near tailbone   . Seizures [facility])    Patient Active Problem List   Diagnosis Date Noted  . Laceration of extensor tendon of left forearm 07/17/2018   Home Medication(s) Prior to Admission medications   Medication Sig Start Date End Date Taking? Authorizing Provider  celecoxib (CELEBREX) 200 MG capsule Take 1 capsule (200 mg total) by mouth 2 (two) times daily as needed (for pain). 07/03/19   Arthor Captain, PA-C  cyclobenzaprine (FLEXERIL) 10 MG tablet Take 0.5-1 tablets (5-10 mg total) by mouth 2 (two) times daily as needed for muscle spasms. 07/03/19   Arthor Captain, PA-C  doxycycline (VIBRAMYCIN) 100 MG capsule Take 1 capsule (100 mg total) by mouth 2 (two) times daily. Patient not taking: Reported on 04/26/2019 02/01/19   Mardella Layman, MD  HYDROcodone-acetaminophen (NORCO/VICODIN) 5-325 MG tablet Take 1 tablet by mouth every 6 (six) hours as needed for moderate pain or severe pain. Patient not taking: Reported on 04/26/2019 02/01/19   Mardella Layman, MD  levETIRAcetam (KEPPRA) 500 MG tablet Take 1 tablet (500 mg total) by mouth 2 (two) times daily.  10/05/19   Nira Conn, MD  naproxen (NAPROSYN) 500 MG tablet Take 1 tablet twice daily as needed for pain. Patient not taking: Reported on 04/26/2019 09/03/18   Molpus, Jonny Ruiz, MD  oxyCODONE (OXY IR/ROXICODONE) 5 MG immediate release tablet Take 1 tablet (5 mg total) by mouth every 6 (six) hours as needed for severe pain. Patient not taking: Reported on 04/26/2019 07/18/18   Luretha Murphy, MD                                                                                                                                    Past Surgical History Past Surgical History:  Procedure Laterality Date  . INCISION AND DRAINAGE OF WOUND Left 07/17/2018   Procedure: WASHOUT AND REPAIR OF NECK LACERATIONS LEFT;  Surgeon: Bradly Bienenstock, MD;  [facility]: [facility] OR;  Service: Orthopedics;  Laterality: Left;  . LACERATION REPAIR Left 07/17/2018  Procedure: LEFT FOREARM REPAIR OF TENDON LACERATIONS AND OTHER NECESSARY PROCEDURE.;  Surgeon: Bradly Bienenstock, MD;  Location: [facility] OR;  Service: Orthopedics;  Laterality: Left;  Marland Kitchen MANDIBLE FRACTURE SURGERY     Family History Family History  Problem Relation Age of Onset  . Seizures Sister     Social History Social History   Tobacco Use  . Smoking status: Never Smoker  . Smokeless tobacco: Never Used  Vaping Use  . Vaping Use: Never used  Substance Use Topics  . Alcohol use: Yes  . Drug use: Yes    Types: Cocaine, Marijuana   Allergies Patient has no known allergies.  Review of Systems Review of Systems All other systems are reviewed and are negative for acute change except as noted in the HPI  Physical Exam Vital Signs  I have reviewed the triage vital signs BP 125/76 (BP Location: Left Arm)   Pulse 80   Temp 97.8 F (36.6 C) (Oral)   Resp 16   SpO2 99%   Physical Exam Vitals reviewed.  Constitutional:      General: He is not in acute distress.    Appearance: He is well-developed. He is not diaphoretic.  HENT:     Head: Normocephalic and  atraumatic.     Jaw: No trismus.     Right Ear: External ear normal.     Left Ear: External ear normal.     Nose: Nose normal.  Eyes:     General: No scleral icterus.    Conjunctiva/sclera: Conjunctivae normal.  Neck:     Trachea: Phonation normal.  Cardiovascular:     Rate and Rhythm: Normal rate and regular rhythm.  Pulmonary:     Effort: Pulmonary effort is normal. No respiratory distress.     Breath sounds: No stridor.  Abdominal:     General: There is no distension.  Musculoskeletal:        General: Normal range of motion.     Cervical back: Normal range of motion.  Neurological:     Mental Status: He is alert and oriented to person, place, and time.  Psychiatric:        Behavior: Behavior normal.     ED Results and Treatments Labs (all labs ordered are listed, but only abnormal results are displayed) Labs Reviewed - No data to display                                                                                                                       EKG  EKG Interpretation  Date/Time:    Ventricular Rate:    PR Interval:    QRS Duration:   QT Interval:    QTC Calculation:   R Axis:     Text Interpretation:        Radiology No results found.  Pertinent labs & imaging results that were available during my care of the patient were reviewed by me and considered in my medical decision making (see chart for details).  Medications Ordered in ED Medications  levETIRAcetam (KEPPRA) IVPB 1500 mg/ 100 mL premix (1,500 mg Intravenous New Bag/Given 10/05/19 2332)                                                                                                                                    Procedures Procedures  (including critical care time)  Medical Decision Making / ED Course I have reviewed the nursing notes for this encounter and the patient's prior records (if available in EHR or on provided paperwork).   [name] was evaluated in Emergency  Department on 10/05/2019 for the symptoms described in the history of present illness. He was evaluated in the context of the global COVID-19 pandemic, which necessitated consideration that the patient might be at risk for infection with the SARS-CoV-2 virus that causes COVID-19. Institutional protocols and algorithms that pertain to the evaluation of patients at risk for COVID-19 are in a state of rapid change based on information released by regulatory bodies including the CDC and federal and state organizations. These policies and algorithms were followed during the patient's care in the ED.  Seizure from noncompliance.  Sedated but otherwise back to baseline. No evidence of trauma noted. HDS. Loaded with Keppra and allowed to MTF the versed.   Eloped prior to reassessment. Noted to be up and ambulatory my tech.     Final Clinical Impression(s) / ED Diagnoses Final diagnoses:  Seizure [facility])      This chart was dictated using voice recognition software.  Despite best efforts to proofread,  errors can occur which can change the documentation meaning.      Nira Conn, MD 10/06/19 [phone]

## 2019-10-06 NOTE — ED Notes (Signed)
Patient eloped with 20G IV to right AC, contacted grandfather and patient without a response, [employer] notified

## 2019-12-11 ENCOUNTER — Emergency Department (HOSPITAL_COMMUNITY)
Admission: EM | Admit: 2019-12-11 | Discharge: 2019-12-11 | Disposition: A | Discharge status: Home / Self Care | Payer: Self-pay | Attending: Emergency Medicine | Admitting: Emergency Medicine

## 2019-12-11 ENCOUNTER — Other Ambulatory Visit: Payer: Self-pay

## 2019-12-11 ENCOUNTER — Encounter (HOSPITAL_COMMUNITY): Payer: Self-pay | Admitting: Emergency Medicine

## 2019-12-11 DIAGNOSIS — J069 Acute upper respiratory infection, unspecified: Secondary | ICD-10-CM | POA: Insufficient documentation

## 2019-12-11 DIAGNOSIS — Z20822 Contact with and (suspected) exposure to covid-19: Secondary | ICD-10-CM | POA: Insufficient documentation

## 2019-12-11 LAB — RESPIRATORY PANEL BY RT PCR (FLU A&B, COVID)
Influenza A by PCR: NEGATIVE
Influenza B by PCR: NEGATIVE
SARS Coronavirus 2 by RT PCR: NEGATIVE

## 2019-12-11 MED ORDER — ALBUTEROL SULFATE HFA 108 (90 BASE) MCG/ACT IN AERS: 1.0000 | INHALATION_SPRAY | Freq: Four times a day (QID) | RESPIRATORY_TRACT | 0 refills | Status: AC | PRN

## 2019-12-11 NOTE — ED Triage Notes (Signed)
Pt. Stated, Ive ad congestion with body aches, I can't smell or taste. Started 5 days ago.No vaccine

## 2019-12-11 NOTE — ED Provider Notes (Signed)
[facility] EMERGENCY DEPARTMENT Provider Note   CSN: [id number] Arrival date & time: 12/11/19  1018     History Chief Complaint  Patient presents with  . Cough  . Nasal Congestion    Alejandro Elliott is a 29 y.o. male.  29 year old male presents with complaint of cough, congestion, body aches and loss of sense of smell and taste for the past 5 days after exposure to his ex who has tested positive for Covid.  Patient is not vaccinated as COVID-19.  Patient has a history of asthma as a child and is a daily smoker.  Denies significant shortness of breath, wheezing.  No other complaints or concerns today.  Alejandro Elliott was evaluated in Emergency Department on 12/11/2019 for the symptoms described in the history of present illness. He was evaluated in the context of the global COVID-19 pandemic, which necessitated consideration that the patient might be at risk for infection with the SARS-CoV-2 virus that causes COVID-19. Institutional protocols and algorithms that pertain to the evaluation of patients at risk for COVID-19 are in a state of rapid change based on information released by regulatory bodies including the CDC and federal and state organizations. These policies and algorithms were followed during the patient's care in the ED.         Past Medical History:  Diagnosis Date  . Asthma   . Cyst near tailbone   . Seizures [facility])     Patient Active Problem List   Diagnosis Date Noted  . Laceration of extensor tendon of left forearm 07/17/2018    Past Surgical History:  Procedure Laterality Date  . INCISION AND DRAINAGE OF WOUND Left 07/17/2018   Procedure: WASHOUT AND REPAIR OF NECK LACERATIONS LEFT;  Surgeon: Bradly Bienenstock, MD;  Location: [facility] OR;  Service: Orthopedics;  Laterality: Left;  . LACERATION REPAIR Left 07/17/2018   Procedure: LEFT FOREARM REPAIR OF TENDON LACERATIONS AND OTHER NECESSARY PROCEDURE.;  Surgeon: Bradly Bienenstock, MD;  Location: [facility] OR;   Service: Orthopedics;  Laterality: Left;  Marland Kitchen MANDIBLE FRACTURE SURGERY         Family History  Problem Relation Age of Onset  . Seizures Sister     Social History   Tobacco Use  . Smoking status: Never Smoker  . Smokeless tobacco: Never Used  Vaping Use  . Vaping Use: Never used  Substance Use Topics  . Alcohol use: Yes  . Drug use: Yes    Types: Cocaine, Marijuana    Home Medications Prior to Admission medications   Medication Sig Start Date End Date Taking? Authorizing Provider  albuterol (VENTOLIN HFA) 108 (90 Base) MCG/ACT inhaler Inhale 1-2 puffs into the lungs every 6 (six) hours as needed for wheezing or shortness of breath. 12/11/19   Jeannie Fend, PA-C  celecoxib (CELEBREX) 200 MG capsule Take 1 capsule (200 mg total) by mouth 2 (two) times daily as needed (for pain). 07/03/19   Arthor Captain, PA-C  cyclobenzaprine (FLEXERIL) 10 MG tablet Take 0.5-1 tablets (5-10 mg total) by mouth 2 (two) times daily as needed for muscle spasms. 07/03/19   Arthor Captain, PA-C  doxycycline (VIBRAMYCIN) 100 MG capsule Take 1 capsule (100 mg total) by mouth 2 (two) times daily. Patient not taking: Reported on 04/26/2019 02/01/19   Mardella Layman, MD  HYDROcodone-acetaminophen (NORCO/VICODIN) 5-325 MG tablet Take 1 tablet by mouth every 6 (six) hours as needed for moderate pain or severe pain. Patient not taking: Reported on 04/26/2019 02/01/19   Hagler,  Arlys John, MD  levETIRAcetam (KEPPRA) 500 MG tablet Take 1 tablet (500 mg total) by mouth 2 (two) times daily. 10/05/19   Nira Conn, MD  naproxen (NAPROSYN) 500 MG tablet Take 1 tablet twice daily as needed for pain. Patient not taking: Reported on 04/26/2019 09/03/18   Molpus, Jonny Ruiz, MD  oxyCODONE (OXY IR/ROXICODONE) 5 MG immediate release tablet Take 1 tablet (5 mg total) by mouth every 6 (six) hours as needed for severe pain. Patient not taking: Reported on 04/26/2019 07/18/18   Luretha Hiroki Wint, MD    Allergies    Patient has no known  allergies.  Review of Systems   Review of Systems  Constitutional: Negative for chills, diaphoresis and fever.  HENT: Positive for congestion. Negative for sore throat.   Respiratory: Positive for cough. Negative for shortness of breath.   Gastrointestinal: Negative for abdominal pain, diarrhea, nausea and vomiting.  Musculoskeletal: Positive for myalgias.  Skin: Negative for rash.  Allergic/Immunologic: Negative for immunocompromised state.  Neurological: Negative for weakness.  Hematological: Negative for adenopathy.  All other systems reviewed and are negative.   Physical Exam Updated Vital Signs BP 131/77 (BP Location: Right Arm)   Pulse 87   Temp 98.2 F (36.8 C) (Oral)   Resp 16   Ht 6' (1.829 m)   Wt 90.7 kg   SpO2 100%   BMI 27.12 kg/m   Physical Exam Vitals and nursing note reviewed.  Constitutional:      General: He is not in acute distress.    Appearance: He is well-developed. He is not diaphoretic.  HENT:     Head: Normocephalic and atraumatic.  Cardiovascular:     Rate and Rhythm: Normal rate and regular rhythm.     Pulses: Normal pulses.     Heart sounds: Normal heart sounds.  Pulmonary:     Effort: Pulmonary effort is normal.     Breath sounds: Normal breath sounds.  Skin:    General: Skin is warm and dry.     Findings: No rash.  Neurological:     Mental Status: He is alert and oriented to person, place, and time.  Psychiatric:        Behavior: Behavior normal.     ED Results / Procedures / Treatments   Labs (all labs ordered are listed, but only abnormal results are displayed) Labs Reviewed  RESPIRATORY PANEL BY RT PCR (FLU A&B, COVID)    EKG None  Radiology No results found.  Procedures Procedures (including critical care time)  Medications Ordered in ED Medications - No data to display  ED Course  I have reviewed the triage vital signs and the nursing notes.  Pertinent labs & imaging results that were available during my  care of the patient were reviewed by me and considered in my medical decision making (see chart for details).  Clinical Course as of Dec 11 1110  Sun Dec 11, 2019  471 29 year old male presents for Covid test.  Symptomatic x5 days after exposure to Covid positive person.  Patient is not vaccinated. On exam, patient is well-appearing, lungs are clear.  Vitals reassuring. Discussed with patient, recommend quarantine for the next 5 days for a total of 10 days, follow-up in the MyChart account for his test results.  If his test is positive, consider contacting clinic for antibody infusion if he so chooses.  Patient has risk factors including BMI over 25, smoking, asthma.   [LM]    Clinical Course User Index [LM] Jeannie Fend,  PA-C   MDM Rules/Calculators/A&P                          Final Clinical Impression(s) / ED Diagnoses Final diagnoses:  Viral URI with cough    Rx / DC Orders ED Discharge Orders         Ordered    albuterol (VENTOLIN HFA) 108 (90 Base) MCG/ACT inhaler  Every 6 hours PRN        12/11/19 1056           Alden Hipp 12/11/19 1113    Geoffery Lyons, MD 12/11/19 1348

## 2019-12-11 NOTE — Discharge Instructions (Signed)
Home to [facility] for the next 5 days. Recommend COVID vaccine after illness. Call to COVID clinic if test is positive to discuss antibody infusion if you would like this treatment. Recommend Tylenol or Motrin for body aches. Use inhaler as needed as prescribed.

## 2020-06-05 ENCOUNTER — Emergency Department (HOSPITAL_COMMUNITY): Payer: Self-pay

## 2020-06-05 ENCOUNTER — Other Ambulatory Visit: Payer: Self-pay

## 2020-06-05 ENCOUNTER — Emergency Department (HOSPITAL_COMMUNITY)
Admission: EM | Admit: 2020-06-05 | Discharge: 2020-06-05 | Disposition: A | Discharge status: Home / Self Care | Payer: Self-pay | Attending: Emergency Medicine | Admitting: Emergency Medicine

## 2020-06-05 DIAGNOSIS — R7989 Other specified abnormal findings of blood chemistry: Secondary | ICD-10-CM

## 2020-06-05 DIAGNOSIS — Z79899 Other long term (current) drug therapy: Secondary | ICD-10-CM | POA: Insufficient documentation

## 2020-06-05 DIAGNOSIS — R4182 Altered mental status, unspecified: Secondary | ICD-10-CM | POA: Insufficient documentation

## 2020-06-05 DIAGNOSIS — R944 Abnormal results of kidney function studies: Secondary | ICD-10-CM | POA: Insufficient documentation

## 2020-06-05 DIAGNOSIS — Y904 Blood alcohol level of 80-99 mg/100 ml: Secondary | ICD-10-CM | POA: Insufficient documentation

## 2020-06-05 DIAGNOSIS — J45909 Unspecified asthma, uncomplicated: Secondary | ICD-10-CM | POA: Insufficient documentation

## 2020-06-05 DIAGNOSIS — G40909 Epilepsy, unspecified, not intractable, without status epilepticus: Secondary | ICD-10-CM | POA: Insufficient documentation

## 2020-06-05 DIAGNOSIS — F10920 Alcohol use, unspecified with intoxication, uncomplicated: Secondary | ICD-10-CM | POA: Insufficient documentation

## 2020-06-05 DIAGNOSIS — R569 Unspecified convulsions: Secondary | ICD-10-CM

## 2020-06-05 LAB — RAPID URINE DRUG SCREEN, HOSP PERFORMED
Amphetamines: NOT DETECTED
Barbiturates: NOT DETECTED
Benzodiazepines: POSITIVE — AB
Cocaine: POSITIVE — AB
Opiates: NOT DETECTED
Tetrahydrocannabinol: NOT DETECTED

## 2020-06-05 LAB — CBC WITH DIFFERENTIAL/PLATELET
Abs Immature Granulocytes: 0.41 10*3/uL — ABNORMAL HIGH (ref 0.00–0.07)
Basophils Absolute: 0.1 10*3/uL (ref 0.0–0.1)
Basophils Relative: 1 %
Eosinophils Absolute: 0.1 10*3/uL (ref 0.0–0.5)
Eosinophils Relative: 0 %
HCT: 47.6 % (ref 39.0–52.0)
Hemoglobin: 14.4 g/dL (ref 13.0–17.0)
Immature Granulocytes: 4 %
Lymphocytes Relative: 34 %
Lymphs Abs: 4 10*3/uL (ref 0.7–4.0)
MCH: 27.3 pg (ref 26.0–34.0)
MCHC: 30.3 g/dL (ref 30.0–36.0)
MCV: 90.3 fL (ref 80.0–100.0)
Monocytes Absolute: 1.1 10*3/uL — ABNORMAL HIGH (ref 0.1–1.0)
Monocytes Relative: 10 %
Neutro Abs: 6 10*3/uL (ref 1.7–7.7)
Neutrophils Relative %: 51 %
Platelets: 258 10*3/uL (ref 150–400)
RBC: 5.27 MIL/uL (ref 4.22–5.81)
RDW: 15.5 % (ref 11.5–15.5)
WBC: 11.6 10*3/uL — ABNORMAL HIGH (ref 4.0–10.5)
nRBC: 0 % (ref 0.0–0.2)

## 2020-06-05 LAB — COMPREHENSIVE METABOLIC PANEL
ALT: 29 U/L (ref 0–44)
AST: 41 U/L (ref 15–41)
Albumin: 3.7 g/dL (ref 3.5–5.0)
Alkaline Phosphatase: 52 U/L (ref 38–126)
Anion gap: 25 — ABNORMAL HIGH (ref 5–15)
BUN: 13 mg/dL (ref 6–20)
CO2: 10 mmol/L — ABNORMAL LOW (ref 22–32)
Calcium: 9 mg/dL (ref 8.9–10.3)
Chloride: 105 mmol/L (ref 98–111)
Creatinine, Ser: 1.73 mg/dL — ABNORMAL HIGH (ref 0.61–1.24)
GFR, Estimated: 54 mL/min — ABNORMAL LOW (ref >60)
Glucose, Bld: 86 mg/dL (ref 70–99)
Potassium: 4.5 mmol/L (ref 3.5–5.1)
Sodium: 140 mmol/L (ref 135–145)
Total Bilirubin: 1 mg/dL (ref 0.3–1.2)
Total Protein: 6.3 g/dL — ABNORMAL LOW (ref 6.5–8.1)

## 2020-06-05 LAB — BASIC METABOLIC PANEL
Anion gap: 8 (ref 5–15)
BUN: 12 mg/dL (ref 6–20)
CO2: 23 mmol/L (ref 22–32)
Calcium: 8.3 mg/dL — ABNORMAL LOW (ref 8.9–10.3)
Chloride: 109 mmol/L (ref 98–111)
Creatinine, Ser: 1.43 mg/dL — ABNORMAL HIGH (ref 0.61–1.24)
GFR, Estimated: >60 mL/min (ref >60)
Glucose, Bld: 78 mg/dL (ref 70–99)
Potassium: 4.1 mmol/L (ref 3.5–5.1)
Sodium: 140 mmol/L (ref 135–145)

## 2020-06-05 LAB — MAGNESIUM: Magnesium: 3 mg/dL — ABNORMAL HIGH (ref 1.7–2.4)

## 2020-06-05 LAB — ETHANOL: Alcohol, Ethyl (B): 98 mg/dL — ABNORMAL HIGH (ref <10)

## 2020-06-05 MED ORDER — LEVETIRACETAM 500 MG PO TABS: 500.0000 mg | ORAL_TABLET | Freq: Two times a day (BID) | ORAL | 0 refills | Status: AC

## 2020-06-05 MED ORDER — LEVETIRACETAM IN NACL 1000 MG/100ML IV SOLN
1000.0000 mg | Freq: Once | INTRAVENOUS | Status: AC
  Administered 2020-06-05: 1000 mg via INTRAVENOUS
  Filled 2020-06-05: qty 100

## 2020-06-05 MED ORDER — SODIUM CHLORIDE 0.9 % IV BOLUS
1000.0000 mL | Freq: Once | INTRAVENOUS | Status: AC
  Administered 2020-06-05: 1000 mL via INTRAVENOUS

## 2020-06-05 NOTE — ED Notes (Signed)
Pt continues to sleep.

## 2020-06-05 NOTE — ED Notes (Addendum)
Pt ambulated to the bathroom to have a BM. No acute distress noted. Pt verbalized no c/o.

## 2020-06-05 NOTE — ED Notes (Signed)
Pts sheets changed, Restraints remain in place. Pt still does not respond to voice or pain.

## 2020-06-05 NOTE — ED Notes (Signed)
Pt returned from CT. Pt remains unresponsive.

## 2020-06-05 NOTE — ED Notes (Signed)
Pt arrived with [employer] after running from arrest. Pt was cuffed and sitting on the curb when "he had a grand mal seizure." Pt arrived in restraints and this nurse has kept them in place as pt had removed his IV in his post ictal confusion. Will release restraints when pt is awake and able to be corporative with care.

## 2020-06-05 NOTE — ED Notes (Signed)
Pt now awake and oriented. Speaking with officers at this time.

## 2020-06-05 NOTE — ED Triage Notes (Signed)
[facility] in restraints. Pt running from police, sitting in cuffs and had a witnessed grand mal seizure

## 2020-06-05 NOTE — ED Notes (Signed)
MD at bedside. 

## 2020-06-05 NOTE — ED Provider Notes (Signed)
  Physical Exam  BP 124/69   Pulse 91   Temp 97.6 F (36.4 C) (Axillary)   Resp 16   SpO2 100%   Physical Exam  ED Course/Procedures     Procedures  MDM  Received patient in signout.  Had a seizure while being arrested.  Initially sedated.  Appears to have gotten 5 mg of Versed by EMS.  Had some confusion required restraints.  Blood work improved after treatment.  States he does hurt all over but is been able ambulate to the bathroom.  Will discharge home.  Leaving in custody with the police.  Prescription given for Tessie Eke, MD 06/05/20 1030

## 2020-06-05 NOTE — Discharge Instructions (Addendum)
Follow-up with your doctors.  Take your seizure medication.

## 2020-06-05 NOTE — ED Provider Notes (Signed)
[facility] EMERGENCY DEPARTMENT Provider Note   CSN: [id number] Arrival date & time: 06/05/20  0300   History Chief Complaint  Patient presents with  . Seizures    Alejandro Elliott is a 30 y.o. male.  The history is provided by the EMS personnel. The history is limited by the condition of the patient (Altered mental status).  Seizures Has history of asthma, polysubstance abuse, seizures and was brought in following a witnessed seizure.  He was apparently running from police and during an altercation with police was sprayed with pepper spray.  He was being taken into custody when he had a witnessed generalized seizure.  There is no incontinence or bit lip or tongue.  He is supposed to be taking levetiracetam, but it is not known if he has been taking it.  Past Medical History:  Diagnosis Date  . Asthma   . Cyst near tailbone   . Seizures [facility])     Patient Active Problem List   Diagnosis Date Noted  . Laceration of extensor tendon of left forearm 07/17/2018    Past Surgical History:  Procedure Laterality Date  . INCISION AND DRAINAGE OF WOUND Left 07/17/2018   Procedure: WASHOUT AND REPAIR OF NECK LACERATIONS LEFT;  Surgeon: Bradly Bienenstock, MD;  Location: [facility] OR;  Service: Orthopedics;  Laterality: Left;  . LACERATION REPAIR Left 07/17/2018   Procedure: LEFT FOREARM REPAIR OF TENDON LACERATIONS AND OTHER NECESSARY PROCEDURE.;  Surgeon: Bradly Bienenstock, MD;  Location: [facility] OR;  Service: Orthopedics;  Laterality: Left;  Marland Kitchen MANDIBLE FRACTURE SURGERY         Family History  Problem Relation Age of Onset  . Seizures Sister     Social History   Tobacco Use  . Smoking status: Never Smoker  . Smokeless tobacco: Never Used  Vaping Use  . Vaping Use: Never used  Substance Use Topics  . Alcohol use: Yes  . Drug use: Yes    Types: Cocaine, Marijuana    Home Medications Prior to Admission medications   Medication Sig Start Date End Date Taking? Authorizing Provider   albuterol (VENTOLIN HFA) 108 (90 Base) MCG/ACT inhaler Inhale 1-2 puffs into the lungs every 6 (six) hours as needed for wheezing or shortness of breath. 12/11/19   Jeannie Fend, PA-C  celecoxib (CELEBREX) 200 MG capsule Take 1 capsule (200 mg total) by mouth 2 (two) times daily as needed (for pain). 07/03/19   Arthor Captain, PA-C  cyclobenzaprine (FLEXERIL) 10 MG tablet Take 0.5-1 tablets (5-10 mg total) by mouth 2 (two) times daily as needed for muscle spasms. 07/03/19   Arthor Captain, PA-C  doxycycline (VIBRAMYCIN) 100 MG capsule Take 1 capsule (100 mg total) by mouth 2 (two) times daily. Patient not taking: Reported on 04/26/2019 02/01/19   Mardella Layman, MD  HYDROcodone-acetaminophen (NORCO/VICODIN) 5-325 MG tablet Take 1 tablet by mouth every 6 (six) hours as needed for moderate pain or severe pain. Patient not taking: Reported on 04/26/2019 02/01/19   Mardella Layman, MD  levETIRAcetam (KEPPRA) 500 MG tablet Take 1 tablet (500 mg total) by mouth 2 (two) times daily. 10/05/19   Nira Conn, MD  naproxen (NAPROSYN) 500 MG tablet Take 1 tablet twice daily as needed for pain. Patient not taking: Reported on 04/26/2019 09/03/18   Molpus, Jonny Ruiz, MD  oxyCODONE (OXY IR/ROXICODONE) 5 MG immediate release tablet Take 1 tablet (5 mg total) by mouth every 6 (six) hours as needed for severe pain. Patient not taking: Reported on  04/26/2019 07/18/18   Luretha Murphy, MD    Allergies    Patient has no known allergies.  Review of Systems   Review of Systems  Unable to perform ROS: Mental status change  Neurological: Positive for seizures.    Physical Exam Updated Vital Signs BP (!) 93/40 (BP Location: Left Arm)   Pulse 97   Temp 97.6 F (36.4 C) (Axillary)   Resp (!) 22   SpO2 96%   Physical Exam Vitals and nursing note reviewed.   30 year old male, resting comfortably and in no acute distress. Vital signs are significant for low blood pressure and slightly elevated respiratory rate.  Oxygen saturation is 96%, which is normal. Head is normocephalic and atraumatic.  Pupils are 3 mm and sluggishly reactive. Oropharynx is clear. Neck is supple without adenopathy or JVD. Lungs are clear without rales, wheezes, or rhonchi. Chest is nontender. Heart has regular rate and rhythm without murmur. Abdomen is soft, flat, masses or hepatosplenomegaly and peristalsis is normoactive. Extremities have no cyanosis or edema, full range of motion is present. Skin is warm and dry without rash. Neurologic: Unresponsive to verbal and painful stimuli, but is protecting his airway.  No focal deficits seen.  ED Results / Procedures / Treatments   Labs (all labs ordered are listed, but only abnormal results are displayed) Labs Reviewed  ETHANOL - Abnormal; Notable for the following components:      Result Value   Alcohol, Ethyl (B) 98 (*)    All other components within normal limits  COMPREHENSIVE METABOLIC PANEL - Abnormal; Notable for the following components:   CO2 10 (*)    Creatinine, Ser 1.73 (*)    Total Protein 6.3 (*)    GFR, Estimated 54 (*)    Anion gap 25 (*)    All other components within normal limits  CBC WITH DIFFERENTIAL/PLATELET - Abnormal; Notable for the following components:   WBC 11.6 (*)    Monocytes Absolute 1.1 (*)    Abs Immature Granulocytes 0.41 (*)    All other components within normal limits  MAGNESIUM - Abnormal; Notable for the following components:   Magnesium 3.0 (*)    All other components within normal limits  RAPID URINE DRUG SCREEN, HOSP PERFORMED  BASIC METABOLIC PANEL    EKG EKG Interpretation  Date/Time:  Tuesday June 05 2020 03:08:32 EDT Ventricular Rate:  106 PR Interval:    QRS Duration: 103 QT Interval:  358 QTC Calculation: 476 R Axis:   62 Text Interpretation: Sinus tachycardia Probable left atrial enlargement RSR' in V1 or V2, probably normal variant Borderline prolonged QT interval When compared with ECG of 09/24/2019, HEART  RATE has increased QT has lengthened Confirmed by Dione Booze ([id number]) on 06/05/2020 3:23:18 AM   Radiology No results found.  Procedures Procedures  CRITICAL CARE Performed by: Dione Booze Total critical care time: 35 minutes Critical care time was exclusive of separately billable procedures and treating other patients. Critical care was necessary to treat or prevent imminent or life-threatening deterioration. Critical care was time spent personally by me on the following activities: development of treatment plan with patient and/or surrogate as well as nursing, discussions with consultants, evaluation of patient's response to treatment, examination of patient, obtaining history from patient or surrogate, ordering and performing treatments and interventions, ordering and review of laboratory studies, ordering and review of radiographic studies, pulse oximetry and re-evaluation of patient's condition.  Medications Ordered in ED Medications  sodium chloride 0.9 % bolus 1,000  mL (has no administration in time range)  levETIRAcetam (KEPPRA) IVPB 1000 mg/100 mL premix (has no administration in time range)    ED Course  I have reviewed the triage vital signs and the nursing notes.  Pertinent labs & imaging results that were available during my care of the patient were reviewed by me and considered in my medical decision making (see chart for details).  MDM Rules/Calculators/A&P Patient with known history of seizure disorder arise in the ED poorly responsive and most likely postictal.  Old records are reviewed, and he has prior ED visits for seizures and medication noncompliance.  He also has ED visits for polysubstance abuse.  Will check screening labs and drug screen, give loading dose of levetiracetam.  As patient had been violent just prior to the seizure, he is kept in restraints until he wakes up and proves that he is not violent.  7:09 AM Patient continues to be unresponsive, but also  continues to maintain airway protection and adequate oxygen saturation.  Labs show elevated creatinine compared with 09/24/2019.  Ethanol is in the range consistent with legal intoxication, but not excessively high.  Metabolic acidosis is noted consistent with recent seizure.  With his failure to wake up, will send for CT of head to make sure that there was not an occult head injury causing his altered mentation.  We will also repeat electrolytes to make sure acidosis is cleared.  Case is signed out to Dr. Rubin Payor.  Final Clinical Impression(s) / ED Diagnoses Final diagnoses:  Seizure (HCC)  Altered mental status, unspecified altered mental status type  Alcohol intoxication, uncomplicated (HCC)  Elevated serum creatinine    Rx / DC Orders ED Discharge Orders    None       Dione Booze, MD 06/05/20 [phone]

## 2020-07-23 ENCOUNTER — Other Ambulatory Visit: Payer: Self-pay

## 2020-07-23 ENCOUNTER — Encounter (HOSPITAL_COMMUNITY): Payer: Self-pay | Admitting: Registered Nurse

## 2020-07-23 ENCOUNTER — Ambulatory Visit (HOSPITAL_COMMUNITY)
Admission: EM | Admit: 2020-07-23 | Discharge: 2020-07-23 | Disposition: A | Discharge status: Home / Self Care | Payer: No Payment, Other | Attending: Registered Nurse | Admitting: Registered Nurse

## 2020-07-23 DIAGNOSIS — F1410 Cocaine abuse, uncomplicated: Secondary | ICD-10-CM | POA: Diagnosis present

## 2020-07-23 DIAGNOSIS — Z560 Unemployment, unspecified: Secondary | ICD-10-CM | POA: Insufficient documentation

## 2020-07-23 DIAGNOSIS — F1490 Cocaine use, unspecified, uncomplicated: Secondary | ICD-10-CM | POA: Insufficient documentation

## 2020-07-23 DIAGNOSIS — Z789 Other specified health status: Secondary | ICD-10-CM | POA: Diagnosis present

## 2020-07-23 NOTE — ED Provider Notes (Signed)
Behavioral Health Urgent Care Medical Screening Exam  Patient Name: Alejandro Elliott MRN: [id number] Date of Evaluation: 07/23/20 Chief Complaint:   Diagnosis:  Final diagnoses:  Cocaine use disorder (HCC)  Needs assistance with community resources    History of Present illness: Alejandro Elliott is a 30 y.o. male patient presented to [facility] as a walk in accompanied by his girlfriend with complaints of needing resources for substance abuse facilities  Chancy Milroy, 30 y.o., male patient seen face to face by this provider, consulted with Dr. Nelly Rout; and chart reviewed on 07/23/20.  On evaluation [name] reports that he uses cocaine an it is affecting his relationship. Patient states he wants to go to a rehab facility to get help.  Patient reports no prior psychiatric history and no prior rehab services.  Patient denies suicidal/self-harm/homicidal ideation, psychosis, and paranoia.  Patient states he is currently unemployed but looking for work.  Girlfriend and family is supportive During evaluation [name] is sitting up right in chair in no acute distress.  He is alert, oriented x 4.  Patient is calm and cooperative.  His mood is euthymic with congruent affect.  He does not appear to be responding to internal/external stimuli or delusional thoughts.  Patient denies suicidal/self-harm/homicidal ideation, psychosis, and paranoia.  Patient answered question appropriately.    Psychiatric Specialty Exam  Presentation  General Appearance:Appropriate for Environment; Casual  Eye Contact:Good  Speech:Clear and Coherent; Normal Rate  Speech Volume:Normal  Handedness:Right   Mood and Affect  Mood:Euthymic  Affect:Appropriate; Congruent   Thought Process  Thought Processes:Coherent; Goal Directed  Descriptions of Associations:Intact  Orientation:Full (Time, Place and Person)  Thought Content:Logical; WDL    Hallucinations:None  Ideas of Reference:None  Suicidal  Thoughts:No  Homicidal Thoughts:No   Sensorium  Memory:Immediate Good; Recent Good  Judgment:Intact  Insight:Present   Executive Functions  Concentration:No data recorded Attention Span:Good  Recall:Good  Fund of Knowledge:Good  Language:Good   Psychomotor Activity  Psychomotor Activity:Normal   Assets  Assets:Communication Skills; Desire for Improvement; Housing; Leisure Time; Physical Health; Resilience; Social Support; Transportation   Sleep  Sleep:Good  Number of hours: No data recorded  Nutritional Assessment (For OBS and FBC admissions only) Has the patient had a weight loss or gain of 10 pounds or more in the last 3 months?: No Has the patient had a decrease in food intake/or appetite?: No Does the patient have dental problems?: No Does the patient have eating habits or behaviors that may be indicators of an eating disorder including binging or inducing vomiting?: No Has the patient recently lost weight without trying?: No Has the patient been eating poorly because of a decreased appetite?: No Malnutrition Screening Tool Score: 0    Physical Exam: Physical Exam Vitals and nursing note reviewed. Exam conducted with a chaperone present.  Constitutional:      General: He is not in acute distress.    Appearance: Normal appearance. He is not ill-appearing.  HENT:     Head: Normocephalic.  Cardiovascular:     Rate and Rhythm: Normal rate.  Pulmonary:     Effort: Pulmonary effort is normal.  Musculoskeletal:        General: Normal range of motion.     Cervical back: Normal range of motion.  Skin:    General: Skin is warm and dry.  Neurological:     General: No focal deficit present.     Mental Status: He is alert and oriented to person, place,  and time.  Psychiatric:        Attention and Perception: Attention and perception normal. He does not perceive auditory or visual hallucinations.        Mood and Affect: Mood and affect normal.         Speech: Speech normal.        Behavior: Behavior normal. Behavior is cooperative.        Thought Content: Thought content normal. Thought content is not delusional. Thought content does not include homicidal or suicidal ideation.        Cognition and Memory: Cognition and memory normal.        Judgment: Judgment normal.    Review of Systems  Constitutional: Negative.   HENT: Negative.   Eyes: Negative.   Respiratory: Negative.   Cardiovascular: Negative.   Gastrointestinal: Negative.   Genitourinary: Negative.   Musculoskeletal: Negative.   Skin: Negative.   Neurological: Negative.   Endo/Heme/Allergies: Negative.   Psychiatric/Behavioral: Negative for depression (Denies) and suicidal ideas. Hallucinations: Denies. Memory loss: Denies. Substance abuse: Cocaine. Nervous/anxious: Denies. Insomnia: Denies.    Blood pressure 128/76, pulse 68, temperature 98.5 F (36.9 C), temperature source Oral, resp. rate 16, SpO2 100 %. There is no height or weight on file to calculate BMI.  Musculoskeletal: Strength & Muscle Tone: within normal limits Gait & Station: normal Patient leans: N/A  [facility] MSE Discharge Disposition for Follow up and Recommendations: Based on my evaluation the patient does not appear to have an emergency medical condition and can be discharged with resources and follow up care in outpatient services for Substance Abuse Intensive Outpatient Program and Group Therapy  Resource handouts given along with  Follow-up Information    Call  [facility]   Specialty: Addiction Medicine Why: Please call to determine bed availability on a DAILY basis. Be sure to have any discharge paperwork available once you contact facility. Contact information: [address] [place] [address] [phone]             Substance Abuse Resources  [facility] - Admissions are currently completed Monday through Friday at 8 am;  both appointments and walk-ins are accepted.  Any individual that is a [facility] resident may present for a substance abuse screening and assessment for admission.  A person may be referred by numerous sources or self-refer.   Potential clients will be screened for medical necessity and appropriateness for the program.  Clients must meet criteria for high-intensity residential treatment services.  If clinically appropriate, a client will continue with the comprehensive clinical assessment and intake process, as well as enrollment in the [facility].   Address: [address] [place], [place] [address] Admin Hours: Mon-Fri 8AM to [facility] Center Hours: 24/7 Phone: [phone] Fax: [phone]   [facility] (Det[facility]) Facility Based Crisis:  These are 3 locations for services: Please call before arrival    Address: [address] [place], [place] [address] Phone: [phone]   Address: [address] [place], [place] [address] Phone#: [phone]   Address: [address] [place], [place] [address] Phone#: [phone]     [facility] ([facility]): (offers outpatient therapy and intensive outpatient substance abuse therapy).  [address] [place], [place] [address] Phone: [phone]   [facility] : Offers FREE recovery skills classes, support groups, 1:1 Peer Support, and Compeer Classes. [address] [place], [place] [address] Phone: [phone] (  Call to complete intake).  [facility] [address] [place], [place] [address] Phone: [phone] ext: [phone] [facility] provides food, shelter and other programs and services to the homeless men of [place] through our [facility] [facility] [employer] [facility].   By offering safe shelter, three meals a day, clean clothing, Biblical counseling, financial planning, vocational training, GED/education and employment assistance, we've helped mend  the shattered lives of many homeless men since opening in 1974.   We have approximately 267 beds available, with a max of 312 beds including mats for emergency situations and currently house an average of 270 men a night.   Prospective Client Check-In Information Photo ID Required (State/ Out of State/ [facility]) - if photo ID is not available, clients are required to have a printout of a police/sheriff's criminal history report. Help out with chores around the Mission. No sex offender of any type (pending, charged, registered and/or any other sex related offenses) will be permitted to check in. Must be willing to abide by all rules, regulations, and policies established by the [facility]. The following will be provided - shelter, food, clothing, and biblical counseling. If you or someone you know is in need of assistance at our [facility] in [place], [place], please call [phone] ext. [phone].   [facility]will provide timely access to mental health services for children and adolescents (4-17) and adults presenting in a mental health crisis. The program is designed for those who need urgent Behavioral Health or Substance Use treatment and are not experiencing a medical crisis that would typically require an emergency room visit.    [address] [place], [place] [address] Phone: [phone] [email or web address]   [facility]: Phone#: [phone]   The [facility] SA Intensive Outpatient Program (SAIOP) means structured individual and group addiction activities and services that are provided at an outpatient program designed to assist adult and adolescent consumers to begin recovery and learn skills for recovery maintenance. The [facility] SAIOP program is offered at least 3 hours a day, 3 days a week.SAIOP services shall include a structured program consisting of, but not limited to, the following services: Individual  counseling and support; Group counseling and support; Family counseling, training or support; Biochemical assays to identify recent drug use (e.g., urine drug screens); Strategies for relapse prevention to include community and social support systems in treatment; Life skills; Crisis contingency planning; Disease Management; and Treatment support activities that have been adapted or specifically designed for persons with physical disabilities, or persons with co-occurring disorders of mental illness and substance abuse/dependence or mental retardation/developmental disability and substance abuse/dependence. Phone: [phone]  Address:    [facility] will also offer the following outpatient services: (Monday through Friday 8am-5pm)    Partial Hospitalization Program (PHP)  Substance Abuse Intensive Outpatient Program (SA-IOP)  Group Therapy  Medication Management  Peer Living Room We also provide (24/7):  Assessments: Our mental health clinician and providers will conduct a focused mental health evaluation, assessing for immediate safety concerns and further mental health needs. Referral: Our team will provide resources and help connect to community based mental health treatment, when indicated, including psychotherapy, psychiatry, and other specialized behavioral health or substance use disorder services (for those not already in treatment). Transitional Care: Our team providers in person bridging and/or telephonic follow-up during the patient's transition to outpatient services.   [facility] Call [facility] 24-Hour Call Center: [phone]  Behavioral Health Crisis Line: [phone]  Norberta Stobaugh, NP 07/23/2020, 2:31 PM

## 2020-07-23 NOTE — Discharge Instructions (Signed)
[facility] - Admissions are currently completed Monday through Friday at 8am; both appointments and walk-ins are accepted.  Any individual that is a [facility] resident may present for a substance abuse screening and assessment for admission.  A person may be referred by numerous sources or self-refer.   Potential clients will be screened for medical necessity and appropriateness for the program.  Clients must meet criteria for high-intensity residential treatment services.  If clinically appropriate, a client will continue with the comprehensive clinical assessment and intake process, as well as enrollment in the [facility].   Address: [address] [place], [place] [address] Admin Hours: Mon-Fri 8AM to [facility] Hours: 24/7 Phone: [phone] Fax: [phone]   [facility] (Det[facility]) Facility Based Crisis:  These are 3 locations for services: Please call before arrival    Address: [address] [place], [place] [address] Phone: [phone]   Address: [address] [place], [place] [address] Phone#: [phone]   Address: [address] [place], [place] [address] Phone#: [phone]     [facility]): (offers outpatient therapy and intensive outpatient substance abuse therapy).  [address], [place], [place] [address] Phone: [phone]   [facility]: Offers FREE recovery skills classes, support groups, 1:1 Peer Support, and Compeer Classes. [address] [place], [place] [address] Phone: [phone] (Call to complete intake).  [facility] [address] [place], [place] [address] Phone: [phone] ext: [phone] The [facility] provides food, shelter and other programs and services to the homeless men of [place] through our [employer].   By offering safe shelter, three meals a day, clean clothing, Biblical  counseling, financial planning, vocational training, GED/education and employment assistance, we've helped mend the shattered lives of many homeless men since opening in 1974.   We have approximately 267 beds available, with a max of 312 beds including mats for emergency situations and currently house an average of 270 men a night.   Prospective Client Check-In Information Photo ID Required (State/ Out of State/ [facility]) - if photo ID is not available, clients are required to have a printout of a police/sheriff's criminal history report. Help out with chores around the [facility]. No sex offender of any type (pending, charged, registered and/or any other sex related offenses) will be permitted to check in. Must be willing to abide by all rules, regulations, and policies established by the [employer]. The following will be provided - shelter, food, clothing, and biblical counseling. If you or someone you know is in need of assistance at our [facility] shelter in [place], [place], please call [phone] ext. [phone].   [facility]-will provide timely access to mental health services for children and adolescents (4-17) and adults presenting in a mental health crisis. The program is designed for those who need urgent Behavioral Health or Substance Use treatment and are not experiencing a medical crisis that would typically require an emergency room visit.    [address] [place], [place] [address] Phone: [phone] [email or web address]   [facility]: Phone#: [phone]   The [facility] SA Intensive Outpatient Program (SAIOP) means structured individual and group addiction activities and services that are provided at an outpatient program designed to assist adult and adolescent consumers to begin recovery and learn skills for recovery maintenance. The [facility] SAIOP program is offered at least 3 hours a  day, 3 days a week.SAIOP  services shall include a structured program consisting of, but not limited to, the following services: Individual counseling and support; Group counseling and support; Family counseling, training or support; Biochemical assays to identify recent drug use (e.g., urine drug screens); Strategies for relapse prevention to include community and social support systems in treatment; Life skills; Crisis contingency planning; Disease Management; and Treatment support activities that have been adapted or specifically designed for persons with physical disabilities, or persons with co-occurring disorders of mental illness and substance abuse/dependence or mental retardation/developmental disability and substance abuse/dependence. Phone: [phone]  Address:    The [facility] will also offer the following outpatient services: (Monday through Friday 8am-5pm)   Partial Hospitalization Program (PHP) Substance Abuse Intensive Outpatient Program (SA-IOP) Group Therapy Medication Management Peer Living Room We also provide (24/7):  Assessments: Our mental health clinician and providers will conduct a focused mental health evaluation, assessing for immediate safety concerns and further mental health needs. Referral: Our team will provide resources and help connect to community based mental health treatment, when indicated, including psychotherapy, psychiatry, and other specialized behavioral health or substance use disorder services (for those not already in treatment). Transitional Care: Our team providers in person bridging and/or telephonic follow-up during the patient's transition to outpatient services.  The [facility] 24-Hour Call Center: [phone]  Behavioral Health Crisis Line: [phone]

## 2020-07-23 NOTE — BH Assessment (Signed)
Pt to [facility] voluntarily with chief complaint of addiction to cocaine. Pt reports using cocaine for 8 yrs and denies hx of SA treatment. Pt reports substance use is putting a strain on his marriage and he is motivated for treatment. Pt reports using $100 worth of crack yesterday and reports daily use. Pt denies SI, HI, AVH.   Pt is routine.

## 2020-07-23 NOTE — Progress Notes (Signed)
Patient received After Visit Summary with community resources and to follow up with [facility] for Bed availability. Patient denies SI, HI and AVH. Discharged home.

## 2020-09-10 ENCOUNTER — Emergency Department (HOSPITAL_COMMUNITY): Payer: Self-pay

## 2020-09-10 ENCOUNTER — Other Ambulatory Visit: Payer: Self-pay

## 2020-09-10 ENCOUNTER — Encounter (HOSPITAL_COMMUNITY): Payer: Self-pay

## 2020-09-10 ENCOUNTER — Emergency Department (HOSPITAL_COMMUNITY)
Admission: EM | Admit: 2020-09-10 | Discharge: 2020-09-10 | Disposition: A | Discharge status: Home / Self Care | Payer: Self-pay | Attending: Emergency Medicine | Admitting: Emergency Medicine

## 2020-09-10 DIAGNOSIS — M542 Cervicalgia: Secondary | ICD-10-CM | POA: Insufficient documentation

## 2020-09-10 DIAGNOSIS — R0781 Pleurodynia: Secondary | ICD-10-CM | POA: Insufficient documentation

## 2020-09-10 DIAGNOSIS — Z5321 Procedure and treatment not carried out due to patient leaving prior to being seen by health care provider: Secondary | ICD-10-CM | POA: Insufficient documentation

## 2020-09-10 NOTE — ED Notes (Signed)
Called patient to move to room, unable to locate at this time. 
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
## 2020-09-10 NOTE — ED Notes (Signed)
Called pt for room no answer 

## 2020-09-10 NOTE — ED Notes (Signed)
Called no answer X2 
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
## 2021-02-05 ENCOUNTER — Encounter (HOSPITAL_COMMUNITY): Payer: Self-pay

## 2021-02-05 ENCOUNTER — Emergency Department (HOSPITAL_COMMUNITY)
Admission: EM | Admit: 2021-02-05 | Discharge: 2021-02-05 | Disposition: A | Discharge status: Home / Self Care | Payer: Self-pay | Attending: Emergency Medicine | Admitting: Emergency Medicine

## 2021-02-05 ENCOUNTER — Emergency Department (HOSPITAL_COMMUNITY): Payer: Self-pay

## 2021-02-05 DIAGNOSIS — X781XXA Intentional self-harm by knife, initial encounter: Secondary | ICD-10-CM | POA: Insufficient documentation

## 2021-02-05 DIAGNOSIS — Y906 Blood alcohol level of 120-199 mg/100 ml: Secondary | ICD-10-CM | POA: Insufficient documentation

## 2021-02-05 DIAGNOSIS — S299XXA Unspecified injury of thorax, initial encounter: Secondary | ICD-10-CM | POA: Insufficient documentation

## 2021-02-05 DIAGNOSIS — S31119A Laceration without foreign body of abdominal wall, unspecified quadrant without penetration into peritoneal cavity, initial encounter: Secondary | ICD-10-CM

## 2021-02-05 DIAGNOSIS — S31139A Puncture wound of abdominal wall without foreign body, unspecified quadrant without penetration into peritoneal cavity, initial encounter: Secondary | ICD-10-CM | POA: Insufficient documentation

## 2021-02-05 DIAGNOSIS — Z20822 Contact with and (suspected) exposure to covid-19: Secondary | ICD-10-CM | POA: Insufficient documentation

## 2021-02-05 DIAGNOSIS — R45851 Suicidal ideations: Secondary | ICD-10-CM | POA: Insufficient documentation

## 2021-02-05 DIAGNOSIS — T1490XA Injury, unspecified, initial encounter: Secondary | ICD-10-CM

## 2021-02-05 DIAGNOSIS — F19959 Other psychoactive substance use, unspecified with psychoactive substance-induced psychotic disorder, unspecified: Secondary | ICD-10-CM

## 2021-02-05 DIAGNOSIS — Z23 Encounter for immunization: Secondary | ICD-10-CM | POA: Insufficient documentation

## 2021-02-05 LAB — COMPREHENSIVE METABOLIC PANEL
ALT: 22 U/L (ref 0–44)
AST: 31 U/L (ref 15–41)
Albumin: 3.5 g/dL (ref 3.5–5.0)
Alkaline Phosphatase: 56 U/L (ref 38–126)
Anion gap: 11 (ref 5–15)
BUN: 8 mg/dL (ref 6–20)
CO2: 23 mmol/L (ref 22–32)
Calcium: 8.6 mg/dL — ABNORMAL LOW (ref 8.9–10.3)
Chloride: 106 mmol/L (ref 98–111)
Creatinine, Ser: 1.18 mg/dL (ref 0.61–1.24)
GFR, Estimated: >60 mL/min (ref >60)
Glucose, Bld: 89 mg/dL (ref 70–99)
Potassium: 3.9 mmol/L (ref 3.5–5.1)
Sodium: 140 mmol/L (ref 135–145)
Total Bilirubin: 0.7 mg/dL (ref 0.3–1.2)
Total Protein: 5.8 g/dL — ABNORMAL LOW (ref 6.5–8.1)

## 2021-02-05 LAB — CBC
HCT: 43.3 % (ref 39.0–52.0)
Hemoglobin: 13.9 g/dL (ref 13.0–17.0)
MCH: 27.5 pg (ref 26.0–34.0)
MCHC: 32.1 g/dL (ref 30.0–36.0)
MCV: 85.6 fL (ref 80.0–100.0)
Platelets: 282 10*3/uL (ref 150–400)
RBC: 5.06 MIL/uL (ref 4.22–5.81)
RDW: 15.6 % — ABNORMAL HIGH (ref 11.5–15.5)
WBC: 5.6 10*3/uL (ref 4.0–10.5)
nRBC: 0 % (ref 0.0–0.2)

## 2021-02-05 LAB — I-STAT CHEM 8, ED
BUN: 8 mg/dL (ref 6–20)
Calcium, Ion: 0.94 mmol/L — ABNORMAL LOW (ref 1.15–1.40)
Chloride: 107 mmol/L (ref 98–111)
Creatinine, Ser: 1.3 mg/dL — ABNORMAL HIGH (ref 0.61–1.24)
Glucose, Bld: 89 mg/dL (ref 70–99)
HCT: 47 % (ref 39.0–52.0)
Hemoglobin: 16 g/dL (ref 13.0–17.0)
Potassium: 3.7 mmol/L (ref 3.5–5.1)
Sodium: 141 mmol/L (ref 135–145)
TCO2: 23 mmol/L (ref 22–32)

## 2021-02-05 LAB — LACTIC ACID, PLASMA: Lactic Acid, Venous: 4.1 mmol/L (ref 0.5–1.9)

## 2021-02-05 LAB — HEPATITIS B SURFACE ANTIGEN: Hepatitis B Surface Ag: NONREACTIVE

## 2021-02-05 LAB — HEPATITIS PANEL, ACUTE
HCV Ab: NONREACTIVE
Hep A IgM: NONREACTIVE
Hep B C IgM: NONREACTIVE
Hepatitis B Surface Ag: NONREACTIVE

## 2021-02-05 LAB — RESP PANEL BY RT-PCR (FLU A&B, COVID) ARPGX2
Influenza A by PCR: NEGATIVE
Influenza B by PCR: NEGATIVE
SARS Coronavirus 2 by RT PCR: NEGATIVE

## 2021-02-05 LAB — PROTIME-INR
INR: 1 (ref 0.8–1.2)
Prothrombin Time: 12.9 seconds (ref 11.4–15.2)

## 2021-02-05 LAB — SAMPLE TO BLOOD BANK

## 2021-02-05 LAB — RAPID HIV SCREEN (HIV 1/2 AB+AG)
HIV 1/2 Antibodies: NONREACTIVE
HIV-1 P24 Antigen - HIV24: NONREACTIVE

## 2021-02-05 LAB — ETHANOL: Alcohol, Ethyl (B): 134 mg/dL — ABNORMAL HIGH (ref <10)

## 2021-02-05 MED ORDER — HALOPERIDOL LACTATE 5 MG/ML IJ SOLN
10.0000 mg | Freq: Once | INTRAMUSCULAR | Status: AC
  Administered 2021-02-05: 10 mg via INTRAVENOUS
  Filled 2021-02-05: qty 2

## 2021-02-05 MED ORDER — IOHEXOL 350 MG/ML SOLN
100.0000 mL | Freq: Once | INTRAVENOUS | Status: AC | PRN
  Administered 2021-02-05: 100 mL via INTRAVENOUS

## 2021-02-05 MED ORDER — TETANUS-DIPHTH-ACELL PERTUSSIS 5-2.5-18.5 LF-MCG/0.5 IM SUSY
0.5000 mL | PREFILLED_SYRINGE | Freq: Once | INTRAMUSCULAR | Status: AC
  Administered 2021-02-05: 0.5 mL via INTRAMUSCULAR
  Filled 2021-02-05: qty 0.5
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
## 2021-02-05 NOTE — ED Notes (Signed)
Pt ambulatory with steady gait to restroom. Pt denies SI/HI. Pt calm and corroborative.
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
## 2021-02-05 NOTE — Discharge Instructions (Addendum)
1.  The use of alcohol and possibly other substance abuse has contributed to an event of loss of control and attempt at self injury.  At this time, it appears this occurred due to substance use disorder.  Please review discharge instructions regarding substance use disorder and seek treatment as soon as possible.  Failure to do so could result in serious intentional or unintentional injury to yourself or others. 2.  You have a small cut to your abdomen.  Keep this clean and apply an antibiotic ointment with a small dressing until it is starting to heal.  Watch for any signs of infection, drainage, swelling or worsening redness.  Return to emergency department if symptoms develop. 3.  You should seek treatment for possible depression.  At this time you do not report any suicidal thoughts or plans.  You should however seek treatment if you feel the symptoms at any point.  He should return to emergency department immediately if you feel that you might hurt or injure yourself or anyone else.
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
## 2021-02-05 NOTE — ED Notes (Signed)
Pt placed in purple scrubs, belongings inventoried and placed in locker #8

## 2021-02-05 NOTE — Progress Notes (Signed)
Orthopedic Tech Progress Note Patient Details:  Alejandro Elliott 1991/02/19 [id number]  Patient ID: Alejandro Elliott, male   DOB: 09-Nov-1990, 30 y.o.   MRN: [id number] I attended trauma page [name] 02/05/2021, 5:01 AM
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
## 2021-03-31 ENCOUNTER — Emergency Department (HOSPITAL_COMMUNITY)
Admission: EM | Admit: 2021-03-31 | Discharge: 2021-03-31 | Disposition: A | Discharge status: Home / Self Care | Payer: Self-pay | Attending: Emergency Medicine | Admitting: Emergency Medicine

## 2021-03-31 ENCOUNTER — Encounter (HOSPITAL_COMMUNITY): Payer: Self-pay | Admitting: *Deleted

## 2021-03-31 ENCOUNTER — Other Ambulatory Visit: Payer: Self-pay

## 2021-03-31 ENCOUNTER — Emergency Department (HOSPITAL_COMMUNITY): Payer: Self-pay

## 2021-03-31 DIAGNOSIS — W228XXA Striking against or struck by other objects, initial encounter: Secondary | ICD-10-CM | POA: Insufficient documentation

## 2021-03-31 DIAGNOSIS — S93602A Unspecified sprain of left foot, initial encounter: Secondary | ICD-10-CM | POA: Insufficient documentation

## 2021-03-31 DIAGNOSIS — M2012 Hallux valgus (acquired), left foot: Secondary | ICD-10-CM | POA: Insufficient documentation

## 2021-03-31 MED ORDER — ACETAMINOPHEN 500 MG PO TABS: 500.0000 mg | ORAL_TABLET | Freq: Four times a day (QID) | ORAL | 0 refills | Status: AC | PRN

## 2021-03-31 MED ORDER — ACETAMINOPHEN 500 MG PO TABS
1000.0000 mg | ORAL_TABLET | Freq: Once | ORAL | Status: AC
  Administered 2021-03-31: 1000 mg via ORAL
  Filled 2021-03-31: qty 2

## 2021-03-31 NOTE — Discharge Instructions (Addendum)
You have been evaluated for your foot injury.  Fortunately x-ray did not show any broken bone.  This is likely a foot sprain.  Take Tylenol or ibuprofen as needed for pain.

## 2021-03-31 NOTE — ED Provider Notes (Signed)
[facility] EMERGENCY DEPARTMENT Provider Note   CSN: [id number] Arrival date & time: 03/31/21  8657     History  No chief complaint on file.   Alejandro Elliott is a 31 y.o. male.  The history is provided by the patient. No language interpreter was used.   31 year old male significant history of polysubstance use who brought here via EMS for evaluation of foot injury.  Patient endorsed accidentally stump his left foot against a curb last night.  He endorsed pain to his toes and his forefoot.  Pain is moderate in severity nonradiating.  No other injury.  He was able to ambulate.  No complaint of ankle pain.  No report of numbness.  Home Medications Prior to Admission medications   Medication Sig Start Date End Date Taking? Authorizing Provider  albuterol (VENTOLIN HFA) 108 (90 Base) MCG/ACT inhaler Inhale 1-2 puffs into the lungs every 6 (six) hours as needed for wheezing or shortness of breath. 12/11/19   Jeannie Fend, PA-C  celecoxib (CELEBREX) 200 MG capsule Take 1 capsule (200 mg total) by mouth 2 (two) times daily as needed (for pain). 07/03/19   Arthor Captain, PA-C  cyclobenzaprine (FLEXERIL) 10 MG tablet Take 0.5-1 tablets (5-10 mg total) by mouth 2 (two) times daily as needed for muscle spasms. 07/03/19   Harris, Cammy Copa, PA-C  levETIRAcetam (KEPPRA) 500 MG tablet Take 1 tablet (500 mg total) by mouth 2 (two) times daily. 06/05/20   Benjiman Core, MD      Allergies    Patient has no known allergies.    Review of Systems   Review of Systems  Constitutional:  Negative for fever.  Musculoskeletal:  Positive for myalgias.  Skin:  Negative for wound.   Physical Exam Updated Vital Signs BP 120/69    Pulse 87    Temp 98.4 F (36.9 C) (Oral)    Resp 16    SpO2 100%  Physical Exam Vitals and nursing note reviewed.  Constitutional:      General: He is not in acute distress.    Appearance: He is well-developed.  HENT:     Head: Atraumatic.  Eyes:      Conjunctiva/sclera: Conjunctivae normal.  Musculoskeletal:        General: Tenderness (Left foot: Tenderness noted to the toes and forefoot on palpation with mild swelling noted.  No laceration.  Intact dorsalis pedis pulse.) present.     Cervical back: Neck supple.  Skin:    Findings: No rash.  Neurological:     Mental Status: He is alert.    ED Results / Procedures / Treatments   Labs (all labs ordered are listed, but only abnormal results are displayed) Labs Reviewed - No data to display  EKG None  Radiology DG Foot Complete Left  Result Date: 03/31/2021 CLINICAL DATA:  Foot trauma. EXAM: LEFT FOOT - COMPLETE 3+ VIEW COMPARISON:  None. FINDINGS: There is no evidence of fracture or dislocation. There is no evidence of arthropathy or other focal bone abnormality. There is mild soft tissue swelling in the forefoot. Mild hallux valgus. IMPRESSION: Soft tissue swelling without evidence of fractures. Mild hallux valgus. Electronically Signed   By: Almira Bar M.D.   On: 03/31/2021 04:20    Procedures Procedures    Medications Ordered in ED Medications  acetaminophen (TYLENOL) tablet 1,000 mg (1,000 mg Oral Given 03/31/21 0848)    ED Course/ Medical Decision Making/ A&P  Medical Decision Making  BP 120/69    Pulse 87    Temp 98.4 F (36.9 C) (Oral)    Resp 16    SpO2 100%   8:14 AM Patient here for injury of his left foot when he hit it on a curb last night.  He has some mild tenderness to the dorsum of the foot with some mild swelling noted but no skin laceration and no evidence of any deformity.  X-ray of the left foot was reviewed and independently interpreted by me.  No evidence of fracture or dislocation.  RICE therapy discussed.  Patient stable for discharge.  Ace wrap applied and Tylenol given for pain        Final Clinical Impression(s) / ED Diagnoses Final diagnoses:  Foot sprain, left, initial encounter    Rx / DC Orders ED  Discharge Orders          Ordered    acetaminophen (TYLENOL) 500 MG tablet  Every 6 hours PRN        03/31/21 0814              Fayrene Helper, PA-C 03/31/21 2585    Pricilla Loveless, MD 03/31/21 [phone]

## 2021-03-31 NOTE — ED Provider Triage Note (Signed)
Emergency Medicine Provider Triage Evaluation Note  Alejandro Elliott , a 31 y.o. male  was evaluated in triage.  Pt complains of left toe/foot pain.  States that he was running tonight and stubbed his toe.  Denies ankle pain.  Denies treatment PTA.    Review of Systems  Positive: Toe pain Negative: weakness  Physical Exam  There were no vitals taken for this visit. Gen:   Awake, no distress   Resp:  Normal effort  MSK:   Moves extremities without difficulty  Other:    Medical Decision Making  Medically screening exam initiated at 3:39 AM.  Appropriate orders placed.  Alejandro Elliott was informed that the remainder of the evaluation will be completed by another provider, this initial triage assessment does not replace that evaluation, and the importance of remaining in the ED until their evaluation is complete.  Toe pain   Roxy Horseman, PA-C 03/31/21 1660

## 2021-03-31 NOTE — ED Triage Notes (Signed)
Pt arrives via [facility] with c/o foot pain. Tripped on a curb and cracked his foot. Left foot pain. Pain with weight bearing.

## 2021-09-10 ENCOUNTER — Emergency Department (HOSPITAL_COMMUNITY): Payer: Self-pay

## 2021-09-10 ENCOUNTER — Encounter (HOSPITAL_COMMUNITY): Payer: Self-pay

## 2021-09-10 ENCOUNTER — Other Ambulatory Visit: Payer: Self-pay

## 2021-09-10 ENCOUNTER — Emergency Department (HOSPITAL_COMMUNITY)
Admission: EM | Admit: 2021-09-10 | Discharge: 2021-09-10 | Disposition: A | Discharge status: Home / Self Care | Payer: Self-pay | Attending: Emergency Medicine | Admitting: Emergency Medicine

## 2021-09-10 DIAGNOSIS — W208XXA Other cause of strike by thrown, projected or falling object, initial encounter: Secondary | ICD-10-CM | POA: Insufficient documentation

## 2021-09-10 DIAGNOSIS — S62334A Displaced fracture of neck of fourth metacarpal bone, right hand, initial encounter for closed fracture: Secondary | ICD-10-CM | POA: Insufficient documentation

## 2021-09-10 DIAGNOSIS — J45909 Unspecified asthma, uncomplicated: Secondary | ICD-10-CM | POA: Insufficient documentation

## 2021-09-10 DIAGNOSIS — S62316A Displaced fracture of base of fifth metacarpal bone, right hand, initial encounter for closed fracture: Secondary | ICD-10-CM | POA: Insufficient documentation

## 2021-09-10 MED ORDER — HYDROCODONE-ACETAMINOPHEN 5-325 MG PO TABS
1.0000 | ORAL_TABLET | Freq: Once | ORAL | Status: AC
  Administered 2021-09-10: 1 via ORAL
  Filled 2021-09-10: qty 1

## 2021-09-10 NOTE — ED Triage Notes (Signed)
Pt was at work 12 hours ago and a "heavy object" fell onto right hand. Pt thinks hand is broken. Pt is able to move fingers and pulses are intact.

## 2021-11-26 ENCOUNTER — Emergency Department (HOSPITAL_COMMUNITY)
Admission: EM | Admit: 2021-11-26 | Discharge: 2021-11-27 | Disposition: A | Discharge status: Home / Self Care | Payer: Self-pay | Attending: Emergency Medicine | Admitting: Emergency Medicine

## 2021-11-26 ENCOUNTER — Other Ambulatory Visit: Payer: Self-pay

## 2021-11-26 ENCOUNTER — Encounter (HOSPITAL_COMMUNITY): Payer: Self-pay | Admitting: Emergency Medicine

## 2021-11-26 DIAGNOSIS — H01004 Unspecified blepharitis left upper eyelid: Secondary | ICD-10-CM | POA: Insufficient documentation

## 2021-11-26 NOTE — ED Triage Notes (Signed)
Pt c/o feeling like something is in his left eye. 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

## 2021-11-27 MED ORDER — FLUORESCEIN SODIUM 1 MG OP STRP
1.0000 | ORAL_STRIP | Freq: Once | OPHTHALMIC | Status: AC
  Administered 2021-11-27: 1 via OPHTHALMIC
  Filled 2021-11-27: qty 1

## 2021-11-27 MED ORDER — PROPARACAINE HCL 0.5 % OP SOLN
1.0000 [drp] | Freq: Once | OPHTHALMIC | Status: AC
  Administered 2021-11-27: 1 [drp] via OPHTHALMIC
  Filled 2021-11-27: qty 15

## 2021-11-27 MED ORDER — ERYTHROMYCIN 5 MG/GM OP OINT
1.0000 | TOPICAL_OINTMENT | Freq: Four times a day (QID) | OPHTHALMIC | Status: DC
  Administered 2021-11-27: 1 via OPHTHALMIC
  Filled 2021-11-27: qty 3.5

## 2021-11-27 NOTE — ED Provider Notes (Signed)
[facility] EMERGENCY DEPARTMENT Provider Note   CSN: [id number] Arrival date & time: 11/26/21  2311     History  Chief Complaint  Patient presents with   Eye Pain    Alejandro Elliott is a 31 y.o. male.  The history is provided by the patient.  Eye Pain  Alejandro Elliott is a 31 y.o. male who presents to the Emergency Department complaining of eye irritation.  He complains of 3 days of irritation to the left eye.  He states that the eyelid is uncomfortable.  No significant pain or itching but has irritation from the eyelid rubbing his eye.  No reports of foreign body or trauma.  He does not wear any corrective lenses.  He felt like his vision was slightly blurry today.  He has a history of seizure disorder, no additional medical problems.     Home Medications Prior to Admission medications   Medication Sig Start Date End Date Taking? Authorizing Provider  acetaminophen (TYLENOL) 500 MG tablet Take 1 tablet (500 mg total) by mouth every 6 (six) hours as needed. 03/31/21   Fayrene Helper, PA-C  albuterol (VENTOLIN HFA) 108 (90 Base) MCG/ACT inhaler Inhale 1-2 puffs into the lungs every 6 (six) hours as needed for wheezing or shortness of breath. 12/11/19   Jeannie Fend, PA-C  celecoxib (CELEBREX) 200 MG capsule Take 1 capsule (200 mg total) by mouth 2 (two) times daily as needed (for pain). 07/03/19   Arthor Captain, PA-C  cyclobenzaprine (FLEXERIL) 10 MG tablet Take 0.5-1 tablets (5-10 mg total) by mouth 2 (two) times daily as needed for muscle spasms. 07/03/19   Harris, Cammy Copa, PA-C  levETIRAcetam (KEPPRA) 500 MG tablet Take 1 tablet (500 mg total) by mouth 2 (two) times daily. 06/05/20   Benjiman Core, MD      Allergies    Patient has no known allergies.    Review of Systems   Review of Systems  Eyes:  Positive for pain.  All other systems reviewed and are negative.   Physical Exam Updated Vital Signs BP 125/86 (BP Location: Left Arm)   Pulse 80   Temp  98.6 F (37 C) (Oral)   Resp 16   Ht 6' (1.829 m)   Wt 99.8 kg   SpO2 97%   BMI 29.84 kg/m  Physical Exam Vitals and nursing note reviewed.  Constitutional:      Appearance: He is well-developed.  HENT:     Head: Normocephalic and atraumatic.     Comments: Pupils equal round and reactive, EOMI.  Visual fields grossly intact.  There is mild conjunctival injection to the right eye.  There is mild edema and erythema to the left upper eyelid without any focal fluctuance or drainage.  There is no significant conjunctival injection.  No uptake on fluorescein staining.  No foreign body on ocular examination or on eversion of the lids.   Cardiovascular:     Rate and Rhythm: Normal rate and regular rhythm.  Pulmonary:     Effort: Pulmonary effort is normal. No respiratory distress.  Musculoskeletal:        General: No tenderness.  Skin:    General: Skin is warm and dry.  Neurological:     Mental Status: He is alert and oriented to person, place, and time.     Comments: No asymmetry of facial movements  Psychiatric:        Behavior: Behavior normal.     ED Results / Procedures /  Treatments   Labs (all labs ordered are listed, but only abnormal results are displayed) Labs Reviewed - No data to display  EKG None  Radiology No results found.  Procedures Procedures    Medications Ordered in ED Medications  erythromycin ophthalmic ointment 1 Application (1 Application Left Eye Given 11/27/21 0253)  proparacaine (ALCAINE) 0.5 % ophthalmic solution 1 drop (1 drop Both Eyes Given 11/27/21 0146)  fluorescein ophthalmic strip 1 strip (1 strip Both Eyes Given 11/27/21 0146)    ED Course/ Medical Decision Making/ A&P                           Medical Decision Making Risk Prescription drug management.   Patient here for evaluation of eye discomfort.  No evidence of corneal abrasion, foreign body on examination.  He does have some swelling to the upper lid.  Discussed local care  with warm compresses, will treat with local antibiotic ointment.  Discussed outpatient follow-up if his symptoms do not improve with conservative treatment.       Final Clinical Impression(s) / ED Diagnoses Final diagnoses:  Blepharitis of left upper eyelid, unspecified type    Rx / DC Orders ED Discharge Orders     None         Tilden Fossa, MD 11/27/21 [phone]

## 2021-11-27 NOTE — Discharge Instructions (Addendum)
You can use the provided ointment up to four times daily for the next week.

## 2022-02-20 ENCOUNTER — Emergency Department (HOSPITAL_COMMUNITY)
Admission: EM | Admit: 2022-02-20 | Discharge: 2022-02-20 | Discharge status: Against Medical Advice | Payer: Self-pay | Attending: Physician Assistant | Admitting: Physician Assistant

## 2022-02-20 ENCOUNTER — Other Ambulatory Visit: Payer: Self-pay

## 2022-02-20 DIAGNOSIS — Z5321 Procedure and treatment not carried out due to patient leaving prior to being seen by health care provider: Secondary | ICD-10-CM | POA: Insufficient documentation

## 2022-02-20 DIAGNOSIS — R569 Unspecified convulsions: Secondary | ICD-10-CM | POA: Insufficient documentation

## 2022-02-20 LAB — CBC WITH DIFFERENTIAL/PLATELET
Abs Immature Granulocytes: 0.02 10*3/uL (ref 0.00–0.07)
Basophils Absolute: 0 10*3/uL (ref 0.0–0.1)
Basophils Relative: 0 %
Eosinophils Absolute: 0 10*3/uL (ref 0.0–0.5)
Eosinophils Relative: 1 %
HCT: 46 % (ref 39.0–52.0)
Hemoglobin: 15 g/dL (ref 13.0–17.0)
Immature Granulocytes: 0 %
Lymphocytes Relative: 38 %
Lymphs Abs: 2.3 10*3/uL (ref 0.7–4.0)
MCH: 28 pg (ref 26.0–34.0)
MCHC: 32.6 g/dL (ref 30.0–36.0)
MCV: 85.8 fL (ref 80.0–100.0)
Monocytes Absolute: 0.6 10*3/uL (ref 0.1–1.0)
Monocytes Relative: 9 %
Neutro Abs: 3.1 10*3/uL (ref 1.7–7.7)
Neutrophils Relative %: 52 %
Platelets: 258 10*3/uL (ref 150–400)
RBC: 5.36 MIL/uL (ref 4.22–5.81)
RDW: 15.1 % (ref 11.5–15.5)
WBC: 6.1 10*3/uL (ref 4.0–10.5)
nRBC: 0 % (ref 0.0–0.2)

## 2022-02-20 LAB — COMPREHENSIVE METABOLIC PANEL
ALT: 30 U/L (ref 0–44)
AST: 26 U/L (ref 15–41)
Albumin: 4.1 g/dL (ref 3.5–5.0)
Alkaline Phosphatase: 62 U/L (ref 38–126)
Anion gap: 8 (ref 5–15)
BUN: 9 mg/dL (ref 6–20)
CO2: 24 mmol/L (ref 22–32)
Calcium: 8.7 mg/dL — ABNORMAL LOW (ref 8.9–10.3)
Chloride: 107 mmol/L (ref 98–111)
Creatinine, Ser: 0.87 mg/dL (ref 0.61–1.24)
GFR, Estimated: >60 mL/min (ref >60)
Glucose, Bld: 89 mg/dL (ref 70–99)
Potassium: 3.8 mmol/L (ref 3.5–5.1)
Sodium: 139 mmol/L (ref 135–145)
Total Bilirubin: 0.5 mg/dL (ref 0.3–1.2)
Total Protein: 7.5 g/dL (ref 6.5–8.1)

## 2022-02-20 MED ORDER — LEVETIRACETAM 500 MG PO TABS
1000.0000 mg | ORAL_TABLET | Freq: Once | ORAL | Status: AC
  Administered 2022-02-20: 1000 mg via ORAL
  Filled 2022-02-20: qty 2

## 2022-02-20 NOTE — ED Notes (Signed)
Save dark green in main lab

## 2022-02-20 NOTE — ED Triage Notes (Signed)
Pt reports having a seizure today and passing out. Pt states that his wife and her sister had to get him up. Pt states that he hasn't taken his Keppra in 5 months because he didn't want to be dependent on it.

## 2022-02-20 NOTE — ED Provider Triage Note (Signed)
Emergency Medicine Provider Triage Evaluation Note  Alejandro Elliott , a 31 y.o. male  was evaluated in triage.  Pt complains of seizures.  Pt reports he has been out of Keppra for 4 months.  Pt reports recnt increased stress.  Pt's wife had a miscarriage.  Pt reports having seizures today   Review of Systems  Positive: Hand jerking and twitching  Negative: Fever or chills   Physical Exam  There were no vitals taken for this visit. Gen:   Awake, no distress   Resp:  Normal effort  MSK:   Moves extremities without difficulty  Other:    Medical Decision Making  Medically screening exam initiated at 9:47 PM.  Appropriate orders placed.  KERRI ASCHE was informed that the remainder of the evaluation will be completed by another provider, this initial triage assessment does not replace that evaluation, and the importance of remaining in the ED until their evaluation is complete.     Elson Areas, [place] 02/20/22 2149

## 2022-05-07 ENCOUNTER — Other Ambulatory Visit: Payer: Self-pay

## 2022-05-07 ENCOUNTER — Emergency Department (HOSPITAL_COMMUNITY)
Admission: EM | Admit: 2022-05-07 | Discharge: 2022-05-07 | Disposition: A | Discharge status: Home / Self Care | Payer: Self-pay | Attending: Emergency Medicine | Admitting: Emergency Medicine

## 2022-05-07 ENCOUNTER — Encounter (HOSPITAL_COMMUNITY): Payer: Self-pay

## 2022-05-07 DIAGNOSIS — X781XXA Intentional self-harm by knife, initial encounter: Secondary | ICD-10-CM | POA: Insufficient documentation

## 2022-05-07 DIAGNOSIS — S61512A Laceration without foreign body of left wrist, initial encounter: Secondary | ICD-10-CM

## 2022-05-07 DIAGNOSIS — F10920 Alcohol use, unspecified with intoxication, uncomplicated: Secondary | ICD-10-CM

## 2022-05-07 DIAGNOSIS — R45851 Suicidal ideations: Secondary | ICD-10-CM

## 2022-05-07 LAB — COMPREHENSIVE METABOLIC PANEL
ALT: 35 U/L (ref 0–44)
AST: 44 U/L — ABNORMAL HIGH (ref 15–41)
Albumin: 3.8 g/dL (ref 3.5–5.0)
Alkaline Phosphatase: 47 U/L (ref 38–126)
Anion gap: 17 — ABNORMAL HIGH (ref 5–15)
BUN: 12 mg/dL (ref 6–20)
CO2: 20 mmol/L — ABNORMAL LOW (ref 22–32)
Calcium: 8.9 mg/dL (ref 8.9–10.3)
Chloride: 106 mmol/L (ref 98–111)
Creatinine, Ser: 1.17 mg/dL (ref 0.61–1.24)
GFR, Estimated: >60 mL/min (ref >60)
Glucose, Bld: 104 mg/dL — ABNORMAL HIGH (ref 70–99)
Potassium: 3.8 mmol/L (ref 3.5–5.1)
Sodium: 143 mmol/L (ref 135–145)
Total Bilirubin: 0.6 mg/dL (ref 0.3–1.2)
Total Protein: 6.2 g/dL — ABNORMAL LOW (ref 6.5–8.1)

## 2022-05-07 LAB — CBC WITH DIFFERENTIAL/PLATELET
Abs Immature Granulocytes: 0.05 10*3/uL (ref 0.00–0.07)
Basophils Absolute: 0 10*3/uL (ref 0.0–0.1)
Basophils Relative: 1 %
Eosinophils Absolute: 0 10*3/uL (ref 0.0–0.5)
Eosinophils Relative: 1 %
HCT: 42.9 % (ref 39.0–52.0)
Hemoglobin: 13.8 g/dL (ref 13.0–17.0)
Immature Granulocytes: 1 %
Lymphocytes Relative: 42 %
Lymphs Abs: 2.5 10*3/uL (ref 0.7–4.0)
MCH: 27.9 pg (ref 26.0–34.0)
MCHC: 32.2 g/dL (ref 30.0–36.0)
MCV: 86.8 fL (ref 80.0–100.0)
Monocytes Absolute: 0.6 10*3/uL (ref 0.1–1.0)
Monocytes Relative: 11 %
Neutro Abs: 2.5 10*3/uL (ref 1.7–7.7)
Neutrophils Relative %: 44 %
Platelets: 253 10*3/uL (ref 150–400)
RBC: 4.94 MIL/uL (ref 4.22–5.81)
RDW: 15.2 % (ref 11.5–15.5)
WBC: 5.7 10*3/uL (ref 4.0–10.5)
nRBC: 0 % (ref 0.0–0.2)

## 2022-05-07 LAB — ETHANOL: Alcohol, Ethyl (B): 135 mg/dL — ABNORMAL HIGH (ref <10)

## 2022-05-07 LAB — SALICYLATE LEVEL: Salicylate Lvl: <7 mg/dL — ABNORMAL LOW (ref 7.0–30.0)

## 2022-05-07 LAB — ACETAMINOPHEN LEVEL: Acetaminophen (Tylenol), Serum: <10 ug/mL — ABNORMAL LOW (ref 10–30)

## 2022-05-07 MED ORDER — LIDOCAINE-EPINEPHRINE (PF) 2 %-1:200000 IJ SOLN
INTRAMUSCULAR | Status: AC
  Administered 2022-05-07: 20 mL
  Filled 2022-05-07: qty 20

## 2022-05-07 NOTE — ED Notes (Signed)
Pt has one belonging bag that is labeled and in cabinet at 13-18 nurses station.

## 2022-05-07 NOTE — ED Notes (Signed)
Provider at bedside stapling wound

## 2022-05-07 NOTE — ED Notes (Signed)
Police at bedside.
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
## 2022-05-07 NOTE — Discharge Instructions (Addendum)
You have been seen in the Emergency Department (ED) today for a laceration (cut). Please keep the cut clean but do not submerge it in the water.  Have your staples/sutures removed in:  5-7 Days  Please follow up with your doctor as needed regarding today's emergent visit. Take Tylenol and/or Ibuprofen as needed for pain.  Return to the ED or call your doctor if you notice any signs of infection such as fever >100.36F, increased pain, increased redness, pus, or other symptoms that concern you.   Scarring precautions:  Always keep your cut, scrape or other skin injury clean. Gently wash the area with mild soap and water to keep out germs and remove debris.  To help the injured skin heal, use petroleum jelly to keep the wound moist. Petroleum jelly prevents the wound from drying out and forming a scab; wounds with scabs take longer to heal. This will also help prevent a scar from getting too large, deep or itchy. As long as the wound is cleaned daily, it is not necessary to use anti-bacterial ointments.  After cleaning the wound and applying petroleum jelly or a similar ointment, cover the skin with an adhesive bandage. For large scrapes, sores, burns or persistent redness, it may be helpful to use hydrogel or silicone gel sheets.  Change your bandage daily to keep the wound clean while it heals. If you have skin that is sensitive to adhesives, try a non-adhesive gauze pad with paper tape. If using silicone gel or hydrogel sheets, follow the instructions on the package for changing the sheets.  If your injury requires stitches, follow your doctor's advice on how to care for the wound and when to get the stitches removed. This may help minimize the appearance of a scar.  Apply sunscreen to the wound after it has healed. Sun protection may help reduce red or brown discoloration and help the scar fade faster. Always use a broad-spectrum sunscreen with an SPF of 30 or higher and reapply frequently.     Continue to take all medications as prescribed and do not miss doses. Do not drive or operate heavy machinery unsupervised until cleared by neurology.

## 2022-05-07 NOTE — ED Notes (Signed)
Patient complaining wrap on arm is too tight. Undressed wrapping and placed a telfa bandage over his staples.

## 2022-05-07 NOTE — Consult Note (Addendum)
[place] [facility] ED ASSESSMENT   Reason for Consult:  Suicidal Threats  Referring Physician:  Deno [name] MD  Patient Identification: Alejandro Elliott MRN:  [id number] ED Chief Complaint: Suicidal ideation  Diagnosis:  Principal Problem:   Suicidal ideation   ED Assessment Time Calculation: Start Time: 1011 Stop Time: 1100 Total Time in Minutes (Assessment Completion): 49   Subjective:   Alejandro Elliott is a 32 y.o. male presents to [facility] emergency department accompanied by [employer] due to reports of suicidal threats.  Per involuntary commitment initiated by emergency room provider.  Affidavit and petition states " patient has a history with mental illness, threats to kill his self in front of the police and last wrist in front of them.  Unwilling to stay voluntarily."  Kaleth was seen and evaluated face-to-face by this provider.  Case was staffed with Dr. Cristela Blue, on evaluation patient states his girlfriend called the police on him yesterday he is unsure the reason why she called.  States he has been drinking "4 Loco's" when he was running from the cops he cut his wrist on the fence.  Chart review BAL 135.  UDS pending.  He denied previous suicide attempts or self injures behaviors.  Denied that he is followed by therapy or psychiatry currently.  He denies auditory visual hallucinations.  He provided verbal authorization to follow-up with his significant other Alejandro Elliott provided phone number [phone].  NP attempted to contact significant other for additional collateral.  No answer at the number listed.  Efran observed resting in bed, [employer] at bedside.  Was reported that patient will be taken into they are considering once medically cleared.  Does not appear to be responding to internal stimuli.  Patient's mood is congruent he is calm cooperative alert and oriented x 4.  Chart reviewed patient has a history of substance use with cocaine.  No mental health history  documented.  Patient to be cleared by psychiatry with additional outpatient resources for substance abuse related to reported alcohol use.  Support, encouragement and reassurance was provided.   Alejandro Elliott is resting in bed; he is alert/oriented x 4; calm/cooperative; and mood congruent with affect.  Patient is speaking in a clear tone at moderate volume, and normal pace; with good eye contact. His thought process is coherent and relevant; There is no indication that he is currently responding to internal/external stimuli or experiencing delusional thought content.  Patient denies suicidal/self-harm/homicidal ideation, psychosis, and paranoia.  Patient has remained calm throughout assessment and has answered questions appropriately.    HPI: Per admission assessment note:" Larance Viger is a 32 yo M with a chief complaint of suicidal ideation. Per the EMS and police the patient had been encountered by police after he was reportedly assaulting his girlfriend. Upon their arrival he picked up a kitchen knife and said he was suicidal and tried to cut his wrist. He then was brought here via EMS. Reportedly and route with EMS he had a seizure and was given Versed. He was a bit agitated afterwards and was given Haldol."   Past Psychiatric History: Charted history with suicidal ideations, substance abuse history to cocaine and alcohol.  Risk to Self or Others: Is the patient at risk to self? No Has the patient been a risk to self in the past 6 months? No Has the patient been a risk to self within the distant past? No Is the patient a risk to others? No Has the patient been  a risk to others in the past 6 months? No Has the patient been a risk to others within the distant past? No  [place] Scale:  [place] ED from 05/07/2022 in [facility] Emergency Department at [facility] ED from 02/20/2022 in [facility] Emergency Department at [facility] ED from 11/26/2021 in [facility]  Emergency Department at [place] CATEGORY High Risk No Risk No Risk       AIMS:  , , ,  ,   ASAM:    Substance Abuse:     Past Medical History:  Past Medical History:  Diagnosis Date   Asthma    Cyst near tailbone    Seizures ([place])     Past Surgical History:  Procedure Laterality Date   INCISION AND DRAINAGE OF WOUND Left 07/17/2018   Procedure: WASHOUT AND REPAIR OF NECK LACERATIONS LEFT;  Surgeon: Iran Planas, MD;  Location: [facility];  Service: Orthopedics;  Laterality: Left;   LACERATION REPAIR Left 07/17/2018   Procedure: LEFT FOREARM REPAIR OF TENDON LACERATIONS AND OTHER NECESSARY PROCEDURE.;  Surgeon: Iran Planas, MD;  Location: [facility];  Service: Orthopedics;  Laterality: Left;   MANDIBLE FRACTURE SURGERY     Family History:  Family History  Problem Relation Age of Onset   Seizures Sister    Family Psychiatric  History: unknown Social History:  Social History   Substance and Sexual Activity  Alcohol Use Yes     Social History   Substance and Sexual Activity  Drug Use Yes   Types: Marijuana, Cocaine    Social History   Socioeconomic History   Marital status: Single    Spouse name: Not on file   Number of children: Not on file   Years of education: Not on file   Highest education level: Not on file  Occupational History   Not on file  Tobacco Use   Smoking status: Not on file   Smokeless tobacco: Never  Vaping Use   Vaping Use: Never used  Substance and Sexual Activity   Alcohol use: Yes   Drug use: Yes    Types: Marijuana, Cocaine   Sexual activity: Not on file  Other Topics Concern   Not on file  Social History Narrative   ** Merged History Encounter **       ** Merged History Encounter **       ** Merged History Encounter **       Social Determinants of Radio broadcast assistant Strain: Not on file  Food Insecurity: Not on file  Transportation Needs: Not on file  Physical Activity: Not on file  Stress: Not  on file  Social Connections: Not on file   Additional Social History:    Allergies:  No Known Allergies  Labs:  Results for orders placed or performed during the hospital encounter of 05/07/22 (from the past 48 hour(s))  Comprehensive metabolic panel     Status: Abnormal   Collection Time: 05/07/22  5:00 AM  Result Value Ref Range   Sodium 143 135 - 145 mmol/L   Potassium 3.8 3.5 - 5.1 mmol/L   Chloride 106 98 - 111 mmol/L   CO2 20 (L) 22 - 32 mmol/L   Glucose, Bld 104 (H) 70 - 99 mg/dL    Comment: Glucose reference range applies only to samples taken after fasting for at least 8 hours.   BUN 12 6 - 20 mg/dL   Creatinine, Ser 1.17 0.61 -  1.24 mg/dL   Calcium 8.9 8.9 - 10.3 mg/dL   Total Protein 6.2 (L) 6.5 - 8.1 g/dL   Albumin 3.8 3.5 - 5.0 g/dL   AST 44 (H) 15 - 41 U/L   ALT 35 0 - 44 U/L   Alkaline Phosphatase 47 38 - 126 U/L   Total Bilirubin 0.6 0.3 - 1.2 mg/dL   GFR, Estimated >60 >60 mL/min    Comment: (NOTE) Calculated using the CKD-EPI Creatinine Equation (2021)    Anion gap 17 (H) 5 - 15    Comment: Performed at [facility], [place] [address] [place], [place] [address]  Ethanol     Status: Abnormal   Collection Time: 05/07/22  5:00 AM  Result Value Ref Range   Alcohol, Ethyl (B) 135 (H) <10 mg/dL    Comment: (NOTE) Lowest detectable limit for serum alcohol is 10 mg/dL.  For medical purposes only. Performed at [facility], [place] [address], [place], [place] [address]   CBC with Diff     Status: None   Collection Time: 05/07/22  5:00 AM  Result Value Ref Range   WBC 5.7 4.0 - 10.5 K/uL   RBC 4.94 4.22 - 5.81 MIL/uL   Hemoglobin 13.8 13.0 - 17.0 g/dL   HCT 42.9 39.0 - 52.0 %   MCV 86.8 80.0 - 100.0 fL   MCH 27.9 26.0 - 34.0 pg   MCHC 32.2 30.0 - 36.0 g/dL   RDW 15.2 11.5 - 15.5 %   Platelets 253 150 - 400 K/uL   nRBC 0.0 0.0 - 0.2 %   Neutrophils Relative % 44 %   Neutro Abs 2.5 1.7 - 7.7 K/uL   Lymphocytes Relative  42 %   Lymphs Abs 2.5 0.7 - 4.0 K/uL   Monocytes Relative 11 %   Monocytes Absolute 0.6 0.1 - 1.0 K/uL   Eosinophils Relative 1 %   Eosinophils Absolute 0.0 0.0 - 0.5 K/uL   Basophils Relative 1 %   Basophils Absolute 0.0 0.0 - 0.1 K/uL   Immature Granulocytes 1 %   Abs Immature Granulocytes 0.05 0.00 - 0.07 K/uL    Comment: Performed at [facility], [place] [address], [place], [place] [id number]  Salicylate level     Status: Abnormal   Collection Time: 05/07/22  5:00 AM  Result Value Ref Range   Salicylate Lvl [id number] (L) 7.0 - 30.0 mg/dL    Comment: Performed at [facility], [place] [address] [place], [place] [address]  Acetaminophen level     Status: Abnormal   Collection Time: 05/07/22  5:00 AM  Result Value Ref Range   Acetaminophen (Tylenol), Serum <10 (L) 10 - 30 ug/mL    Comment: (NOTE) Therapeutic concentrations vary significantly. A range of 10-30 ug/mL  may be an effective concentration for many patients. However, some  are best treated at concentrations outside of this range. Acetaminophen concentrations >150 ug/mL at 4 hours after ingestion  and >50 ug/mL at 12 hours after ingestion are often associated with  toxic reactions.  Performed at [facility], [place] [address] [place], [place] [address]     No current facility-administered medications for this encounter.   Current Outpatient Medications  Medication Sig Dispense Refill   acetaminophen (TYLENOL) 500 MG tablet Take 1 tablet (500 mg total) by mouth every 6 (six) hours as needed. 30 tablet 0   levETIRAcetam (KEPPRA) 500 MG tablet Take 1 tablet (500 mg total) by  mouth 2 (two) times daily. 60 tablet 0   albuterol (VENTOLIN HFA) 108 (90 Base) MCG/ACT inhaler Inhale 1-2 puffs into the lungs every 6 (six) hours as needed for wheezing or shortness of breath. (Patient not taking: Reported on 05/07/2022) 18 g 0    Musculoskeletal: Strength & Muscle Tone: within  normal limits Gait & Station: normal Patient leans: N/A   Psychiatric Specialty Exam: Presentation  General Appearance: Appropriate for Environment  Eye Contact:Good  Speech:Clear and Coherent  Speech Volume:Normal  Handedness:Right   Mood and Affect  Mood:Anxious; Depressed  Affect:Congruent   Thought Process  Thought Processes:Coherent  Descriptions of Associations:Intact  Orientation:Full (Time, Place and Person)  Thought Content:Logical  History of Schizophrenia/Schizoaffective disorder:No data recorded Duration of Psychotic Symptoms:No data recorded Hallucinations:Hallucinations: None  Ideas of Reference:None  Suicidal Thoughts:Suicidal Thoughts: No  Homicidal Thoughts:Homicidal Thoughts: No   Sensorium  Memory:Recent Good; Immediate Good  Judgment:Good  Insight:None   Executive Functions  Concentration:Good  Attention Span:Good  [place] of Knowledge:Good  Language:Fair   Psychomotor Activity  Psychomotor Activity:Psychomotor Activity: Normal   Assets  Assets:Desire for Improvement; Social Support    Sleep  Sleep:Sleep: Fair   Physical Exam: Physical Exam Vitals and nursing note reviewed.  Cardiovascular:     Rate and Rhythm: Normal rate and regular rhythm.  Pulmonary:     Effort: Pulmonary effort is normal.  Neurological:     Mental Status: He is alert and oriented to person, place, and time.  Psychiatric:        Mood and Affect: Mood normal.        Behavior: Behavior normal.    Review of Systems  Psychiatric/Behavioral:  Negative for suicidal ideas. The patient is not nervous/anxious.   All other systems reviewed and are negative.  Blood pressure (!) 118/57, pulse 80, temperature 99.2 F (37.3 C), temperature source Oral, resp. rate 14, height 5' 10"$  (1.778 m), weight 99.8 kg, SpO2 98 %. Body mass index is 31.57 kg/m.  Medical Decision Making:  -IVC rescinded -Patient discharged to [facility]  Department custody -Discussed making Depakote and/or Keppra available for reported seizure disorder with EDP  Disposition: Patient does not meet criteria for psychiatric inpatient admission. Supportive therapy provided about ongoing stressors. Refer to IOP.  Substance Abuse Resources   [facility] - Admissions are currently completed Monday through Friday at 8 am; both appointments and walk-ins are accepted.  Any individual that is a [facility] resident may present for a substance abuse screening and assessment for admission.  A person may be referred by numerous sources or self-refer.   Potential clients will be screened for medical necessity and appropriateness for the program.  Clients must meet criteria for high-intensity residential treatment services.  If clinically appropriate, a client will continue with the comprehensive clinical assessment and intake process, as well as enrollment in the [place].   Address: [address] [place], [place] [address] Admin Hours: Mon-Fri 8AM to [place] Hours: 24/7 Phone: [phone] Fax: [phone]   [facility]Det[facility]) Facility Based Crisis:  These are 3 locations for services: Please call before arrival    Address: [address] [place], [place] [address] Phone: [phone]   Address: [address] [place], [place] [address] Phone#: [phone]   Address: [address] [place], [place] [address] Phone#: [phone]     [facility] ([facility]): (offers outpatient therapy and intensive outpatient substance abuse therapy).  [address] [place], [place] [address] Phone: [phone]   [place]: Offers FREE recovery skills classes, support groups, 1:1 Peer Support, and Compeer Classes. [address], [place], [place] [address] Phone: [phone] (Call to complete intake).  [facility] [address] [place], [place] [address] Phone: [phone] ext: [facility] provides food, shelter and other programs and services to the homeless men of [place] through our [employer].   By offering safe shelter, three meals a day, clean clothing, Biblical counseling, financial planning, vocational training, GED/education and employment assistance, we've helped mend the shattered lives of many homeless men since opening in 1974.   We have approximately 267 beds available, with a max of 312 beds including mats for emergency situations and currently house an average of 270 men a night.   Prospective Client Check-In Information Photo ID Required (State/ Out of State/ [facility]) - if photo ID is not available, clients are required to have a printout of a police/sheriff's criminal history report. Help out with chores around the [place]. No sex offender of any type (pending, charged, registered and/or any other sex related offenses) will be permitted to check in. Must be willing to abide by all rules, regulations, and policies established by the [employer]. The following will be provided - shelter, food, clothing, and biblical counseling. If you or someone you know is in need of assistance at our [facility] shelter in [place], [place], please call [phone] ext. [phone].   [facility]-will provide timely access to mental health services for children and adolescents (4-17) and adults presenting in a mental health crisis. The program is designed for those who need urgent Behavioral Health or Substance Use treatment and are not experiencing a medical crisis that would typically require an emergency room visit.    [place], [place] [address] Phone: [phone] [email or web address]: Phone#: [phone]   The [facility] SA Intensive Outpatient Program (SAIOP) means structured  individual and group addiction activities and services that are provided at an outpatient program designed to assist adult and adolescent consumers to begin recovery and learn skills for recovery maintenance. The [place] program is offered at least 3 hours a day, 3 days a week.SAIOP services shall include a structured program consisting of, but not limited to, the following services: Individual counseling and support; Group counseling and support; Family counseling, training or support; Biochemical assays to identify recent drug use (e.g., urine drug screens); Strategies for relapse prevention to include community and social support systems in treatment; Life skills; Crisis contingency planning; Disease Management; and Treatment support activities that have been adapted or specifically designed for persons with physical disabilities, or persons with co-occurring disorders of mental illness and substance abuse/dependence or mental retardation/developmental disability and substance abuse/dependence. Phone: [phone]  Address:    The [facility] will also offer the following outpatient services: (Monday through Friday 8am-5pm)   Partial Hospitalization Program (PHP) Substance Abuse Intensive Outpatient Program (SA-IOP) Group Therapy Medication Management Peer Living Room We also provide (24/7):  Assessments: Our mental health clinician and providers will conduct a focused mental health evaluation, assessing for immediate safety concerns and further mental health needs. Referral: Our team will provide resources and help connect to community based mental health treatment, when indicated, including psychotherapy, psychiatry, and other specialized behavioral health or substance use disorder services (for those not already in treatment).  Transitional Care: Our team providers in person bridging and/or telephonic follow-up during the patient's transition to outpatient services.    The  [facility] 24-Hour Call Center: [phone]   Behavioral Health Crisis Line: [phone]   Derrill [place], NP 05/07/2022 10:28 AM

## 2022-05-07 NOTE — ED Provider Notes (Signed)
Reassessed patient.  He is hemodynamically stable and well-appearing.  No active SI/HI/AVH.  He is no longer clinically intoxicated.  His wrist is repaired and bandaged with no active hemorrhage through the bandage.  He is neurovascularly intact.  He has been psychiatrically cleared by TTS.  He is being discharged into [facility] custody.  He is aware and has no further questions or complaints.   [name], MD 05/07/22 1038

## 2022-05-07 NOTE — ED Provider Notes (Signed)
[place] AT [facility] Provider Note   CSN: [id number] Arrival date & time: 05/07/22  0443     History  Chief Complaint  Patient presents with   Psychiatric Evaluation    Alejandro Elliott is a 32 y.o. male.  32 yo M with a chief complaint of suicidal ideation.  Per the EMS and police the patient had been encountered by police after he was reportedly assaulting his girlfriend.  Upon their arrival he picked up a kitchen knife and said he was suicidal and tried to cut his wrist.  He then was brought here via EMS.  Reportedly and route with EMS he had a seizure and was given Versed.  He was a bit agitated afterwards and was given Haldol.        Home Medications Prior to Admission medications   Medication Sig Start Date End Date Taking? Authorizing Provider  acetaminophen (TYLENOL) 500 MG tablet Take 1 tablet (500 mg total) by mouth every 6 (six) hours as needed. 03/31/21   Domenic Moras, PA-C  albuterol (VENTOLIN HFA) 108 (90 Base) MCG/ACT inhaler Inhale 1-2 puffs into the lungs every 6 (six) hours as needed for wheezing or shortness of breath. 12/11/19   Tacy Learn, PA-C  celecoxib (CELEBREX) 200 MG capsule Take 1 capsule (200 mg total) by mouth 2 (two) times daily as needed (for pain). 07/03/19   Margarita Mail, PA-C  cyclobenzaprine (FLEXERIL) 10 MG tablet Take 0.5-1 tablets (5-10 mg total) by mouth 2 (two) times daily as needed for muscle spasms. 07/03/19   Harris, Vernie Shanks, PA-C  levETIRAcetam (KEPPRA) 500 MG tablet Take 1 tablet (500 mg total) by mouth 2 (two) times daily. 06/05/20   Davonna Belling, MD      Allergies    Patient has no known allergies.    Review of Systems   Review of Systems  Physical Exam Updated Vital Signs BP (!) 113/56 (BP Location: Left Arm)   Pulse 97   Temp 99.2 F (37.3 C) (Oral)   Resp 14   Ht 5' 10"$  (1.778 m)   Wt 99.8 kg   SpO2 96%   BMI 31.57 kg/m  Physical Exam Vitals and nursing note reviewed.   Constitutional:      Appearance: He is well-developed.  HENT:     Head: Normocephalic and atraumatic.  Eyes:     Pupils: Pupils are equal, round, and reactive to light.  Neck:     Vascular: No JVD.  Cardiovascular:     Rate and Rhythm: Normal rate and regular rhythm.     Heart sounds: No murmur heard.    No friction rub. No gallop.  Pulmonary:     Effort: No respiratory distress.     Breath sounds: No wheezing.  Abdominal:     General: There is no distension.     Tenderness: There is no abdominal tenderness. There is no guarding or rebound.  Musculoskeletal:        General: Normal range of motion.     Cervical back: Normal range of motion and neck supple.     Comments: Approximately 5 cm laceration to the volar aspect of the left wrist.  Exposed tendon along the ulnar aspect.  Full range of motion of the fingers.  Pulse intact as well.  Skin:    Coloration: Skin is not pale.     Findings: No rash.  Neurological:     Mental Status: He is alert and oriented to person, place,  and time.  Psychiatric:        Behavior: Behavior normal.     ED Results / Procedures / Treatments   Labs (all labs ordered are listed, but only abnormal results are displayed) Labs Reviewed  COMPREHENSIVE METABOLIC PANEL - Abnormal; Notable for the following components:      Result Value   CO2 20 (*)    Glucose, Bld 104 (*)    Total Protein 6.2 (*)    AST 44 (*)    Anion gap 17 (*)    All other components within normal limits  ETHANOL - Abnormal; Notable for the following components:   Alcohol, Ethyl (B) 135 (*)    All other components within normal limits  SALICYLATE LEVEL - Abnormal; Notable for the following components:   Salicylate Lvl [id number] (*)    All other components within normal limits  ACETAMINOPHEN LEVEL - Abnormal; Notable for the following components:   Acetaminophen (Tylenol), Serum <10 (*)    All other components within normal limits  CBC WITH DIFFERENTIAL/PLATELET  RAPID URINE  DRUG SCREEN, HOSP PERFORMED    EKG EKG Interpretation  Date/Time:  Wednesday May 07 2022 04:54:49 EST Ventricular Rate:  93 PR Interval:  132 QRS Duration: 89 QT Interval:  349 QTC Calculation: 435 R Axis:   24 Text Interpretation: Sinus rhythm LVH by voltage Borderline ST elevation, lateral leads No significant change since last tracing Confirmed by Deno [name] [phone]) on 05/07/2022 5:17:00 AM  Radiology No results found.  Procedures .Marland KitchenLaceration Repair  Date/Time: 05/07/2022 5:14 AM  Performed by: Deno [name], DO Authorized by: Deno [name], DO   Consent:    Consent obtained:  Emergent situation (Patient altered, unable to consent, danger to self)   Risks, benefits, and alternatives were discussed: yes     Risks discussed:  Infection   Alternatives discussed:  No treatment Universal protocol:    Patient identity confirmed:  Verbally with patient Anesthesia:    Anesthesia method:  Local infiltration   Local anesthetic:  Lidocaine 2% WITH epi Laceration details:    Location:  Shoulder/arm   Shoulder/arm location:  L lower arm   Length (cm):  5.5 Pre-procedure details:    Preparation:  Patient was prepped and draped in usual sterile fashion Exploration:    Hemostasis achieved with:  Epinephrine and direct pressure   Wound extent: no tendon damage and no underlying fracture     Contaminated: no   Treatment:    Area cleansed with:  Saline   Amount of cleaning:  Extensive   Irrigation solution:  Sterile water   Irrigation volume:  50   Irrigation method:  Pressure wash   Visualized foreign bodies/material removed: no   Skin repair:    Repair method:  Staples   Number of staples:  8 Approximation:    Approximation:  Close Repair type:    Repair type:  Simple Post-procedure details:    Dressing:  Open (no dressing)   Procedure completion:  Tolerated well, no immediate complications     Medications Ordered in ED Medications  lidocaine-EPINEPHrine (XYLOCAINE  W/EPI) 2 %-1:200000 (PF) injection (20 mLs  Given 05/07/22 0503)    ED Course/ Medical Decision Making/ A&P                             Medical Decision Making Amount and/or Complexity of Data Reviewed Labs: ordered.   32 yo M with a chief complaints of a self-inflicted  wound to his wrist.  The patient upon learning that he was going to be arrested and slit his wrist and then had a seizure en route.  On my record review the patient has had multiple seizures upon being encountered by police.  Wound was repaired at bedside.  For safety of staff it was stapled inside of sutured as the patient was not totally compliant with the procedure.  Tylenol and salicylate level negative.  No anemia, no significant electrolyte abnormality.  Feel he is medically cleared for TTS evaluation. IVC paperwork filed.  The patients results and plan were reviewed and discussed.   Any x-rays performed were independently reviewed by myself.   Differential diagnosis were considered with the presenting HPI.  Medications  lidocaine-EPINEPHrine (XYLOCAINE W/EPI) 2 %-1:200000 (PF) injection (20 mLs  Given 05/07/22 0503)    Vitals:   05/07/22 0454 05/07/22 0456  BP: (!) 113/56   Pulse: 97   Resp: 14   Temp: 99.2 F (37.3 C)   TempSrc: Oral   SpO2: 96%   Weight:  99.8 kg  Height:  5' 10"$  (1.778 m)    Final diagnoses:  Wrist laceration, left, initial encounter    Admission/ observation were discussed with the admitting physician, patient and/or family and they are comfortable with the plan.          Final Clinical Impression(s) / ED Diagnoses Final diagnoses:  Wrist laceration, left, initial encounter    Rx / DC Orders ED Discharge Orders     None         Deno [name], DO 05/07/22 [id number]

## 2022-05-07 NOTE — ED Notes (Signed)
Patient notified again about need for urine. Police escorting patient to the bathroom.

## 2022-05-07 NOTE — ED Provider Notes (Signed)
Emergency Medicine Observation Re-evaluation Note  Alejandro Elliott is a 32 y.o. male, seen on rounds today.  Pt initially presented to the ED for complaints of Psychiatric Evaluation Currently, the patient is under IVC resting.  Physical Exam  BP (!) 118/57   Pulse 80   Temp 99.2 F (37.3 C) (Oral)   Resp 14   Ht 5' 10"$  (1.778 m)   Wt 99.8 kg   SpO2 98%   BMI 31.57 kg/m  Physical Exam General: Resting on right side in bed Cardiac: Regular rate Lungs: Resting breathing comfortably Psych: Calm  ED Course / MDM  EKG:EKG Interpretation  Date/Time:  Wednesday May 07 2022 04:54:49 EST Ventricular Rate:  93 PR Interval:  132 QRS Duration: 89 QT Interval:  349 QTC Calculation: 435 R Axis:   24 Text Interpretation: Sinus rhythm LVH by voltage Borderline ST elevation, lateral leads No significant change since last tracing Confirmed by Deno [name] [phone]) on 05/07/2022 5:17:00 AM  I have reviewed the labs performed to date as well as medications administered while in observation.  Recent changes in the last 24 hours include medical clearance.  TTS evaluation.  Plan  Current plan is for TTS evaluation.  Patient under IVC.    [name], MD 05/07/22 [phone]

## 2022-05-07 NOTE — ED Notes (Signed)
Assumed care of patient. Patient resting comfortably in bed with no signs of acute distress noted. Police at bedside. Waiting on TTS consult.

## 2022-07-28 ENCOUNTER — Other Ambulatory Visit: Payer: Self-pay

## 2022-07-28 ENCOUNTER — Emergency Department (HOSPITAL_COMMUNITY)
Admission: EM | Admit: 2022-07-28 | Discharge: 2022-07-28 | Disposition: A | Discharge status: Home / Self Care | Attending: Emergency Medicine | Admitting: Emergency Medicine

## 2022-07-28 DIAGNOSIS — K0889 Other specified disorders of teeth and supporting structures: Secondary | ICD-10-CM

## 2022-07-28 MED ORDER — OXYCODONE-ACETAMINOPHEN 5-325 MG PO TABS
1.0000 | ORAL_TABLET | Freq: Once | ORAL | Status: AC
  Administered 2022-07-28: 1 via ORAL
  Filled 2022-07-28: qty 1

## 2022-07-28 MED ORDER — AMOXICILLIN 500 MG PO CAPS: 500.0000 mg | ORAL_CAPSULE | Freq: Three times a day (TID) | ORAL | 0 refills | Status: AC

## 2022-07-28 MED ORDER — KETOROLAC TROMETHAMINE 10 MG PO TABS
10.0000 mg | ORAL_TABLET | Freq: Once | ORAL | Status: AC
  Administered 2022-07-28: 10 mg via ORAL
  Filled 2022-07-28: qty 1

## 2022-07-28 MED ORDER — AMOXICILLIN 500 MG PO CAPS
500.0000 mg | ORAL_CAPSULE | Freq: Once | ORAL | Status: AC
  Administered 2022-07-28: 500 mg via ORAL
  Filled 2022-07-28: qty 1

## 2022-07-28 MED ORDER — KETOROLAC TROMETHAMINE 10 MG PO TABS: 10.0000 mg | ORAL_TABLET | Freq: Four times a day (QID) | ORAL | 0 refills | Status: DC | PRN

## 2022-07-28 NOTE — ED Triage Notes (Signed)
Patient reports left upper molar pain this evening .

## 2022-07-28 NOTE — Discharge Instructions (Signed)
Follow-up with a dentist.  Only a dentist can fix your problem.  We recommend that you take amoxicillin as prescribed to treat likely underlying infection.  Take Toradol as prescribed for pain control.  Return to the ED for any new or concerning symptoms.

## 2022-07-28 NOTE — ED Notes (Signed)
Assumed care of patient ambulatory to bed with steady gait c/o left upper molar pain that began tonight. Pt given oxy in triage but states it did not help. Pt laughing with male guest. Pt a/o x 4 respirations even and non labored no facial swelling noted.

## 2022-07-28 NOTE — ED Provider Notes (Signed)
[place] EMERGENCY DEPARTMENT AT [facility] Provider Note   CSN: [id number] Arrival date & time: 07/28/22  0132     History  Chief Complaint  Patient presents with   Dental Pain    Alejandro Elliott is a 32 y.o. male.  The history is provided by the patient. No language interpreter was used.  Dental Pain Location:  Upper Upper teeth location:  14/LU 1st molar Quality:  Aching and throbbing Severity:  Moderate Onset quality:  Gradual Duration:  1 day Timing:  Constant Progression:  Worsening Chronicity:  New Context: dental caries, dental fracture and poor dentition   Context: not trauma   Relieved by:  Nothing Ineffective treatments:  Acetaminophen Associated symptoms: facial pain   Associated symptoms: no facial swelling, no fever, no oral bleeding and no oral lesions   Risk factors: alcohol problem and lack of dental care        Home Medications Prior to Admission medications   Medication Sig Start Date End Date Taking? Authorizing Provider  amoxicillin (AMOXIL) 500 MG capsule Take 1 capsule (500 mg total) by mouth 3 (three) times daily for 10 days. 07/28/22 08/07/22 Yes Antony Madura, PA-C  ketorolac (TORADOL) 10 MG tablet Take 1 tablet (10 mg total) by mouth every 6 (six) hours as needed for severe pain or moderate pain. 07/28/22  Yes Antony Madura, PA-C  acetaminophen (TYLENOL) 500 MG tablet Take 1 tablet (500 mg total) by mouth every 6 (six) hours as needed. 03/31/21   Fayrene Helper, PA-C  albuterol (VENTOLIN HFA) 108 (90 Base) MCG/ACT inhaler Inhale 1-2 puffs into the lungs every 6 (six) hours as needed for wheezing or shortness of breath. Patient not taking: Reported on 05/07/2022 12/11/19   Army Melia A, PA-C  levETIRAcetam (KEPPRA) 500 MG tablet Take 1 tablet (500 mg total) by mouth 2 (two) times daily. 06/05/20   Benjiman Core, MD      Allergies    Patient has no known allergies.    Review of Systems   Review of Systems  Constitutional:  Negative  for fever.  HENT:  Positive for dental problem. Negative for facial swelling and mouth sores.   Ten systems reviewed and are negative for acute change, except as noted in the HPI.    Physical Exam Updated Vital Signs BP (!) 144/103 (BP Location: Right Arm)   Pulse 71   Temp 98.6 F (37 C) (Oral)   Resp 17   SpO2 96%   Physical Exam Vitals and nursing note reviewed.  Constitutional:      General: He is not in acute distress.    Appearance: He is well-developed. He is not diaphoretic.     Comments: Nontoxic appearing. Resting w/o issue.  HENT:     Head: Normocephalic and atraumatic.     Mouth/Throat:      Comments: Dental caries with fracture to portion of molar. No trismus, malocclusion. Normal phonation. Tongue midline and tolerating secretions w/o difficulty. Eyes:     General: No scleral icterus.    Conjunctiva/sclera: Conjunctivae normal.  Neck:     Comments: No meningismus Pulmonary:     Effort: Pulmonary effort is normal. No respiratory distress.  Musculoskeletal:        General: Normal range of motion.     Cervical back: Normal range of motion.  Skin:    General: Skin is warm and dry.     Coloration: Skin is not pale.     Findings: No erythema or rash.  Neurological:     Mental Status: He is alert and oriented to person, place, and time.  Psychiatric:        Behavior: Behavior normal.     ED Results / Procedures / Treatments   Labs (all labs ordered are listed, but only abnormal results are displayed) Labs Reviewed - No data to display  EKG None  Radiology No results found.  Procedures Procedures    Medications Ordered in ED Medications  amoxicillin (AMOXIL) capsule 500 mg (has no administration in time range)  ketorolac (TORADOL) tablet 10 mg (has no administration in time range)  oxyCODONE-acetaminophen (PERCOCET/ROXICET) 5-325 MG per tablet 1 tablet (1 tablet Oral Given 07/28/22 0141)    ED Course/ Medical Decision Making/ A&P                              Medical Decision Making Risk Prescription drug management.   This patient presents to the ED for concern of dental pain, this involves an extensive number of treatment options, and is a complaint that carries with it a high risk of complications and morbidity.  The differential diagnosis includes dental fracture vs dental infection/abscess vs ludwig's angina vs trigeminal neuralgia.   Co morbidities that complicate the patient evaluation  Asthma Seizure d/o Hx of polysubstance abuse   Cardiac Monitoring:  The patient was maintained on a cardiac monitor.  I personally viewed and interpreted the cardiac monitored which showed an underlying rhythm of: NSR   Medicines ordered and prescription drug management:  I ordered medication including Percocet and Toradol for pain, Amoxicillin for infection coverage  Reevaluation of the patient after these medicines showed that the patient improved I have reviewed the patients home medicines and have made adjustments as needed   Test Considered:  Panorex    Problem List / ED Course:  Patient with toothache.  No gross abscess.  Exam unconcerning for Ludwig's angina or spread of infection.  Will treat with amoxicillin and pain medicine.  Urged patient to follow-up with dentist.     Reevaluation:  After the interventions noted above, I reevaluated the patient and found that they have :stayed the same   Social Determinants of Health:  Hx of polysubstance abuse   Dispostion:  After consideration of the diagnostic results and the patients response to treatment, I feel that the patent would benefit from dental follow up. Started on course of abx. Return precautions discussed and provided. Patient discharged in stable condition with no unaddressed concerns.          Final Clinical Impression(s) / ED Diagnoses Final diagnoses:  Dentalgia    Rx / DC Orders ED Discharge Orders          Ordered    amoxicillin  (AMOXIL) 500 MG capsule  3 times daily        07/28/22 0547    ketorolac (TORADOL) 10 MG tablet  Every 6 hours PRN        07/28/22 0547              Antony Madura, PA-C 07/28/22 0601    Sloan Leiter, DO 07/28/22 [phone]

## 2022-11-14 ENCOUNTER — Emergency Department (HOSPITAL_COMMUNITY)
Admission: EM | Admit: 2022-11-14 | Discharge: 2022-11-14 | Disposition: A | Discharge status: Home / Self Care | Payer: No Typology Code available for payment source | Attending: Emergency Medicine | Admitting: Emergency Medicine

## 2022-11-14 ENCOUNTER — Encounter (HOSPITAL_COMMUNITY): Payer: Self-pay

## 2022-11-14 ENCOUNTER — Other Ambulatory Visit: Payer: Self-pay

## 2022-11-14 DIAGNOSIS — L0231 Cutaneous abscess of buttock: Secondary | ICD-10-CM | POA: Insufficient documentation

## 2022-11-14 DIAGNOSIS — L0291 Cutaneous abscess, unspecified: Secondary | ICD-10-CM

## 2022-11-14 MED ORDER — IBUPROFEN 200 MG PO TABS
600.0000 mg | ORAL_TABLET | Freq: Once | ORAL | Status: AC
  Administered 2022-11-14: 600 mg via ORAL
  Filled 2022-11-14: qty 3

## 2022-11-14 MED ORDER — OXYCODONE-ACETAMINOPHEN 5-325 MG PO TABS
1.0000 | ORAL_TABLET | Freq: Once | ORAL | Status: AC
  Administered 2022-11-14: 1 via ORAL
  Filled 2022-11-14: qty 1

## 2022-11-14 MED ORDER — LIDOCAINE HCL (PF) 1 % IJ SOLN
5.0000 mL | Freq: Once | INTRAMUSCULAR | Status: AC
  Administered 2022-11-14: 5 mL
  Filled 2022-11-14: qty 30

## 2022-11-14 NOTE — ED Provider Notes (Signed)
[place] EMERGENCY DEPARTMENT AT [facility] Provider Note   CSN: [id number] Arrival date & time: 11/14/22  1751     History  Chief Complaint  Patient presents with   Cyst    Alejandro Elliott is a 32 y.o. male.  Is a 32 year old male with no significant past medical history presenting to the emergency department for "a cyst on his bottom".  He states that he has had pain and increasing swelling for the last few days.  He states he has been trying to do warm soaks but has not had any drainage.  He denies any fevers.  He states that he has had abscesses on his buttocks in the past.  The history is provided by the patient.       Home Medications Prior to Admission medications   Medication Sig Start Date End Date Taking? Authorizing Provider  acetaminophen (TYLENOL) 500 MG tablet Take 1 tablet (500 mg total) by mouth every 6 (six) hours as needed. 03/31/21   Fayrene Helper, PA-C  albuterol (VENTOLIN HFA) 108 (90 Base) MCG/ACT inhaler Inhale 1-2 puffs into the lungs every 6 (six) hours as needed for wheezing or shortness of breath. Patient not taking: Reported on 05/07/2022 12/11/19   Army Melia A, PA-C  ketorolac (TORADOL) 10 MG tablet Take 1 tablet (10 mg total) by mouth every 6 (six) hours as needed for severe pain or moderate pain. 07/28/22   Antony Madura, PA-C  levETIRAcetam (KEPPRA) 500 MG tablet Take 1 tablet (500 mg total) by mouth 2 (two) times daily. 06/05/20   Benjiman Core, MD      Allergies    Patient has no known allergies.    Review of Systems   Review of Systems  Physical Exam Updated Vital Signs BP 126/76 (BP Location: Left Arm)   Pulse 86   Temp 98.8 F (37.1 C) (Oral)   Resp 16   Ht 5\' 11"  (1.803 m)   Wt 105.7 kg   SpO2 100%   BMI 32.50 kg/m  Physical Exam Vitals and nursing note reviewed.  Constitutional:      General: He is not in acute distress.    Appearance: Normal appearance.  HENT:     Head: Normocephalic.     Nose: Nose normal.      Mouth/Throat:     Mouth: Mucous membranes are moist.  Eyes:     Extraocular Movements: Extraocular movements intact.  Cardiovascular:     Rate and Rhythm: Normal rate.  Pulmonary:     Effort: Pulmonary effort is normal.  Musculoskeletal:        General: Normal range of motion.     Cervical back: Normal range of motion.  Skin:    General: Skin is warm and dry.     Comments: Approximately 2 x 2 area of swelling with palpable fluctuance to the left buttock in the gluteal cleft, no active drainage, no surrounding erythema or warmth  Neurological:     General: No focal deficit present.     Mental Status: He is alert and oriented to person, place, and time.  Psychiatric:        Mood and Affect: Mood normal.        Behavior: Behavior normal.     ED Results / Procedures / Treatments   Labs (all labs ordered are listed, but only abnormal results are displayed) Labs Reviewed - No data to display  EKG None  Radiology No results found.  Procedures .Marland KitchenIncision and Drainage  Date/Time: 11/14/2022 1:53 PM  Performed by: Rexford Maus, DO Authorized by: Rexford Maus, DO   Consent:    Consent obtained:  Verbal   Consent given by:  Patient   Risks, benefits, and alternatives were discussed: yes     Risks discussed:  Bleeding, incomplete drainage, pain, infection and damage to other organs   Alternatives discussed:  No treatment and delayed treatment Universal protocol:    Procedure explained and questions answered to patient or proxy's satisfaction: yes     Patient identity confirmed:  Verbally with patient Location:    Type:  Abscess   Size:  2x2 cm   Location:  Anogenital   Anogenital location:  Gluteal cleft Pre-procedure details:    Skin preparation:  Chlorhexidine with alcohol Sedation:    Sedation type:  None Anesthesia:    Anesthesia method:  Local infiltration   Local anesthetic:  Lidocaine 1% w/o epi Procedure type:    Complexity:   Simple Procedure details:    Ultrasound guidance: yes     Needle aspiration: no     Incision types:  Stab incision   Incision depth:  Dermal   Wound management:  Probed and deloculated   Drainage:  Purulent   Drainage amount:  Moderate   Wound treatment:  Wound left open   Packing materials:  None Post-procedure details:    Procedure completion:  Tolerated well, no immediate complications     Medications Ordered in ED Medications  ibuprofen (ADVIL) tablet 600 mg (600 mg Oral Given 11/14/22 1214)  oxyCODONE-acetaminophen (PERCOCET/ROXICET) 5-325 MG per tablet 1 tablet (1 tablet Oral Given 11/14/22 1214)  lidocaine (PF) (XYLOCAINE) 1 % injection 5 mL (5 mLs Infiltration Given 11/14/22 1214)    ED Course/ Medical Decision Making/ A&P                                 Medical Decision Making This patient presents to the ED with chief complaint(s) of buttock abscess with no pertinent past medical history which further complicates the presenting complaint. The complaint involves an extensive differential diagnosis and also carries with it a high risk of complications and morbidity.    The differential diagnosis includes abscess, no signs of cellulitis, cyst, does not appear to be hidradenitis  Additional history obtained: Additional history obtained from N/A Records reviewed N/A  ED Course and Reassessment: On patient's arrival he is hemodynamically stable in no acute distress.  He does have an area of palpable fluctuance on the left gluteal cleft.  Bedside ultrasound was performed that did show approximately 2 x 2 cm fluid collection without surrounding cellulitis.  I&D was performed per procedure note above.  He tolerated well and is stable for discharge home with outpatient follow-up.  Independent labs interpretation:  N/A  Independent visualization of imaging: - N/A  Consultation: - Consulted or discussed management/test interpretation w/ external professional:  N/A  Consideration for admission or further workup: Patient has no emergent conditions requiring admission or further work-up at this time and is stable for discharge home with primary care follow-up  Social Determinants of health: N/A    Risk OTC drugs. Prescription drug management.          Final Clinical Impression(s) / ED Diagnoses Final diagnoses:  Abscess    Rx / DC Orders ED Discharge Orders     None         Rexford Maus, DO 11/14/22  1400  

## 2022-11-14 NOTE — Discharge Instructions (Addendum)
You were seen in the emergency department for your pain in your buttocks.  You did have abscess that we drained for you in the emergency department.  You should continue to do warm soaks or compresses for at least 20 minutes at a time a few times a day to help encourage drainage.  You can take Tylenol or Motrin as needed for pain.  You should follow-up with your primary doctor in the next few days to have your wound rechecked.  You should return to the emergency department if you have significant increase of pain, you have fevers or if you have any other new or concerning symptoms.

## 2022-11-14 NOTE — ED Triage Notes (Signed)
Pt coming in today complaining of a possible cyst at the bottom of his butt. Pt states it has been there for a couple of days, denies any N/V or fevers.

## 2023-04-21 ENCOUNTER — Emergency Department (HOSPITAL_COMMUNITY)
Admission: EM | Admit: 2023-04-21 | Discharge: 2023-04-21 | Discharge status: Against Medical Advice | Payer: Self-pay | Attending: Emergency Medicine | Admitting: Emergency Medicine

## 2023-04-21 ENCOUNTER — Encounter (HOSPITAL_COMMUNITY): Payer: Self-pay

## 2023-04-21 ENCOUNTER — Emergency Department (HOSPITAL_COMMUNITY): Payer: Self-pay

## 2023-04-21 ENCOUNTER — Other Ambulatory Visit: Payer: Self-pay

## 2023-04-21 DIAGNOSIS — N5082 Scrotal pain: Secondary | ICD-10-CM | POA: Diagnosis present

## 2023-04-21 DIAGNOSIS — Z760 Encounter for issue of repeat prescription: Secondary | ICD-10-CM | POA: Insufficient documentation

## 2023-04-21 DIAGNOSIS — Z5321 Procedure and treatment not carried out due to patient leaving prior to being seen by health care provider: Secondary | ICD-10-CM | POA: Insufficient documentation

## 2023-04-21 LAB — URINALYSIS, ROUTINE W REFLEX MICROSCOPIC
Bilirubin Urine: NEGATIVE
Glucose, UA: NEGATIVE mg/dL
Hgb urine dipstick: NEGATIVE
Ketones, ur: NEGATIVE mg/dL
Leukocytes,Ua: NEGATIVE
Nitrite: NEGATIVE
Protein, ur: NEGATIVE mg/dL
Specific Gravity, Urine: 1.023 (ref 1.005–1.030)
pH: 5 (ref 5.0–8.0)

## 2023-04-21 NOTE — ED Notes (Signed)
Pt called for vitals x2 with no answer. 

## 2023-04-21 NOTE — ED Triage Notes (Signed)
Pt endorses scrotal irritation, sores, and pain x couple of days; using neosporin and baby powder at home; denies discharge, denies fevers; also asking for refill of inhaler and seizure meds; verbal consent given for mse

## 2023-04-21 NOTE — ED Provider Triage Note (Signed)
 Emergency Medicine Provider Triage Evaluation Note  Alejandro Elliott , a 33 y.o. male  was evaluated in triage.  Pt complains of scrotal pain ongoing for the past couple days.  Reports some sores on his scrotum.  Unable to sit down without discomfort.  Denies any dysuria, hematuria, increased frequency.  He is sexually active, asks if he can be tested for gonorrhea and chlamydia, but does not want HIV, syphilis, hepatitis testing.  Review of Systems  Positive: As above Negative: As above  Physical Exam  BP 120/68   Pulse 95   Temp 98.6 F (37 C) (Oral)   Resp 17   Wt 103.9 kg   SpO2 98%   BMI 31.94 kg/m  Gen:   Awake, comfortable appearing, does not want to sit down Resp:  Normal effort  MSK:   Moves extremities without difficulty   GU exam with RN present, scrotal dryness appreciated.  No high riding testicle, but patient very tender to palpation of the testes  Medical Decision Making  Medically screening exam initiated at 1:25 PM.  Appropriate orders placed.  Alejandro Elliott was informed that the remainder of the evaluation will be completed by another provider, this initial triage assessment does not replace that evaluation, and the importance of remaining in the ED until their evaluation is complete.     Veta Palma, PA-C 04/21/23 1326

## 2023-04-23 LAB — GC/CHLAMYDIA PROBE AMP (~~LOC~~) NOT AT ARMC
Chlamydia: NEGATIVE
Comment: NEGATIVE
Comment: NORMAL
Neisseria Gonorrhea: NEGATIVE

## 2023-05-08 ENCOUNTER — Emergency Department (HOSPITAL_COMMUNITY)
Admission: EM | Admit: 2023-05-08 | Discharge: 2023-05-08 | Discharge status: Against Medical Advice | Payer: No Typology Code available for payment source | Attending: Emergency Medicine | Admitting: Emergency Medicine

## 2023-05-08 ENCOUNTER — Ambulatory Visit (HOSPITAL_COMMUNITY)
Admission: EM | Admit: 2023-05-08 | Discharge: 2023-05-08 | Disposition: A | Discharge status: Home / Self Care | Payer: No Typology Code available for payment source

## 2023-05-08 ENCOUNTER — Other Ambulatory Visit: Payer: Self-pay

## 2023-05-08 ENCOUNTER — Encounter (HOSPITAL_COMMUNITY): Payer: Self-pay | Admitting: Emergency Medicine

## 2023-05-08 DIAGNOSIS — R059 Cough, unspecified: Secondary | ICD-10-CM | POA: Diagnosis present

## 2023-05-08 DIAGNOSIS — Z5321 Procedure and treatment not carried out due to patient leaving prior to being seen by health care provider: Secondary | ICD-10-CM | POA: Insufficient documentation

## 2023-05-08 DIAGNOSIS — J101 Influenza due to other identified influenza virus with other respiratory manifestations: Secondary | ICD-10-CM

## 2023-05-08 LAB — RESP PANEL BY RT-PCR (RSV, FLU A&B, COVID)  RVPGX2
Influenza A by PCR: POSITIVE — AB
Influenza B by PCR: NEGATIVE
Resp Syncytial Virus by PCR: NEGATIVE
SARS Coronavirus 2 by RT PCR: NEGATIVE

## 2023-05-08 NOTE — ED Triage Notes (Signed)
 Patient reports three days of generalized body ache, cough, and chills.

## 2023-05-08 NOTE — ED Notes (Signed)
 Pt was seen at ED before coming up to UC to be seen but left before completing visit.  Pt was positive for Flu A.

## 2023-05-08 NOTE — ED Triage Notes (Signed)
 Pt reports since Tuesday cough, congestion, chills, body aches. Took Theraflu.
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
## 2023-05-08 NOTE — Discharge Instructions (Signed)
 Tylenol and/or ibuprofen for fever and aches You can use Delsym or Robitussin for cough Drink lots of fluids You are contagious

## 2023-06-22 ENCOUNTER — Other Ambulatory Visit: Payer: Self-pay

## 2023-06-22 ENCOUNTER — Emergency Department (HOSPITAL_COMMUNITY)
Admission: EM | Admit: 2023-06-22 | Discharge: 2023-06-22 | Discharge status: Against Medical Advice | Payer: Self-pay | Attending: Emergency Medicine | Admitting: Emergency Medicine

## 2023-06-22 ENCOUNTER — Encounter (HOSPITAL_COMMUNITY): Payer: Self-pay | Admitting: Emergency Medicine

## 2023-06-22 DIAGNOSIS — Z5321 Procedure and treatment not carried out due to patient leaving prior to being seen by health care provider: Secondary | ICD-10-CM | POA: Insufficient documentation

## 2023-06-22 DIAGNOSIS — R220 Localized swelling, mass and lump, head: Secondary | ICD-10-CM | POA: Insufficient documentation

## 2023-06-22 NOTE — ED Notes (Signed)
Patient called for room x3 with no answer 

## 2023-06-22 NOTE — ED Triage Notes (Addendum)
 Pt left eye bottom lid swollen x 1 day. Clear drainage. Pt has been using eyedrops with no improvement. No vision changes.

## 2023-12-23 ENCOUNTER — Encounter (HOSPITAL_COMMUNITY): Payer: Self-pay

## 2023-12-23 ENCOUNTER — Emergency Department (HOSPITAL_COMMUNITY)

## 2023-12-23 ENCOUNTER — Other Ambulatory Visit: Payer: Self-pay

## 2023-12-23 ENCOUNTER — Emergency Department (HOSPITAL_COMMUNITY)
Admission: EM | Admit: 2023-12-23 | Discharge: 2023-12-23 | Disposition: A | Discharge status: Home / Self Care | Attending: Emergency Medicine | Admitting: Emergency Medicine

## 2023-12-23 DIAGNOSIS — F1910 Other psychoactive substance abuse, uncomplicated: Secondary | ICD-10-CM | POA: Insufficient documentation

## 2023-12-23 DIAGNOSIS — R000 Tachycardia, unspecified: Secondary | ICD-10-CM | POA: Diagnosis not present

## 2023-12-23 DIAGNOSIS — S6991XA Unspecified injury of right wrist, hand and finger(s), initial encounter: Secondary | ICD-10-CM | POA: Diagnosis present

## 2023-12-23 DIAGNOSIS — J45909 Unspecified asthma, uncomplicated: Secondary | ICD-10-CM | POA: Diagnosis not present

## 2023-12-23 DIAGNOSIS — S0081XA Abrasion of other part of head, initial encounter: Secondary | ICD-10-CM | POA: Insufficient documentation

## 2023-12-23 DIAGNOSIS — T07XXXA Unspecified multiple injuries, initial encounter: Secondary | ICD-10-CM

## 2023-12-23 DIAGNOSIS — F1990 Other psychoactive substance use, unspecified, uncomplicated: Secondary | ICD-10-CM

## 2023-12-23 DIAGNOSIS — S60211A Contusion of right wrist, initial encounter: Secondary | ICD-10-CM | POA: Diagnosis not present

## 2023-12-23 DIAGNOSIS — X58XXXA Exposure to other specified factors, initial encounter: Secondary | ICD-10-CM | POA: Diagnosis not present

## 2023-12-23 LAB — I-STAT CHEM 8, ED
BUN: 7 mg/dL (ref 6–20)
Calcium, Ion: 1.1 mmol/L — ABNORMAL LOW (ref 1.15–1.40)
Chloride: 106 mmol/L (ref 98–111)
Creatinine, Ser: 1.7 mg/dL — ABNORMAL HIGH (ref 0.61–1.24)
Glucose, Bld: 115 mg/dL — ABNORMAL HIGH (ref 70–99)
HCT: 44 % (ref 39.0–52.0)
Hemoglobin: 15 g/dL (ref 13.0–17.0)
Potassium: 3.6 mmol/L (ref 3.5–5.1)
Sodium: 142 mmol/L (ref 135–145)
TCO2: 12 mmol/L — ABNORMAL LOW (ref 22–32)

## 2023-12-23 LAB — COMPREHENSIVE METABOLIC PANEL WITH GFR
ALT: 24 U/L (ref 0–44)
ALT: 21 U/L (ref 0–44)
AST: 28 U/L (ref 15–41)
AST: 26 U/L (ref 15–41)
Albumin: 4.1 g/dL (ref 3.5–5.0)
Albumin: 3.3 g/dL — ABNORMAL LOW (ref 3.5–5.0)
Alkaline Phosphatase: 67 U/L (ref 38–126)
Alkaline Phosphatase: 59 U/L (ref 38–126)
Anion gap: 26 — ABNORMAL HIGH (ref 5–15)
Anion gap: 12 (ref 5–15)
BUN: 8 mg/dL (ref 6–20)
BUN: 7 mg/dL (ref 6–20)
CO2: 11 mmol/L — ABNORMAL LOW (ref 22–32)
CO2: 22 mmol/L (ref 22–32)
Calcium: 9.4 mg/dL (ref 8.9–10.3)
Calcium: 8.4 mg/dL — ABNORMAL LOW (ref 8.9–10.3)
Chloride: 103 mmol/L (ref 98–111)
Chloride: 104 mmol/L (ref 98–111)
Creatinine, Ser: 1.77 mg/dL — ABNORMAL HIGH (ref 0.61–1.24)
Creatinine, Ser: 1.36 mg/dL — ABNORMAL HIGH (ref 0.61–1.24)
GFR, Estimated: 51 mL/min — ABNORMAL LOW (ref >60)
GFR, Estimated: >60 mL/min (ref >60)
Glucose, Bld: 116 mg/dL — ABNORMAL HIGH (ref 70–99)
Glucose, Bld: 102 mg/dL — ABNORMAL HIGH (ref 70–99)
Potassium: 3.7 mmol/L (ref 3.5–5.1)
Potassium: 3.5 mmol/L (ref 3.5–5.1)
Sodium: 140 mmol/L (ref 135–145)
Sodium: 138 mmol/L (ref 135–145)
Total Bilirubin: 0.3 mg/dL (ref 0.0–1.2)
Total Bilirubin: 0.6 mg/dL (ref 0.0–1.2)
Total Protein: 7.4 g/dL (ref 6.5–8.1)
Total Protein: 6 g/dL — ABNORMAL LOW (ref 6.5–8.1)

## 2023-12-23 LAB — CBC WITH DIFFERENTIAL/PLATELET
Abs Immature Granulocytes: 0.09 K/uL — ABNORMAL HIGH (ref 0.00–0.07)
Basophils Absolute: 0 K/uL (ref 0.0–0.1)
Basophils Relative: 0 %
Eosinophils Absolute: 0 K/uL (ref 0.0–0.5)
Eosinophils Relative: 0 %
HCT: 44.3 % (ref 39.0–52.0)
Hemoglobin: 13.3 g/dL (ref 13.0–17.0)
Immature Granulocytes: 1 %
Lymphocytes Relative: 19 %
Lymphs Abs: 2 K/uL (ref 0.7–4.0)
MCH: 26.7 pg (ref 26.0–34.0)
MCHC: 30 g/dL (ref 30.0–36.0)
MCV: 88.8 fL (ref 80.0–100.0)
Monocytes Absolute: 1.1 K/uL — ABNORMAL HIGH (ref 0.1–1.0)
Monocytes Relative: 10 %
Neutro Abs: 7.4 K/uL (ref 1.7–7.7)
Neutrophils Relative %: 70 %
Platelets: 341 K/uL (ref 150–400)
RBC: 4.99 MIL/uL (ref 4.22–5.81)
RDW: 15.1 % (ref 11.5–15.5)
WBC: 10.6 K/uL — ABNORMAL HIGH (ref 4.0–10.5)
nRBC: 0 % (ref 0.0–0.2)

## 2023-12-23 LAB — ACETAMINOPHEN LEVEL: Acetaminophen (Tylenol), Serum: <10 ug/mL — ABNORMAL LOW (ref 10–30)

## 2023-12-23 LAB — TROPONIN I (HIGH SENSITIVITY)
Troponin I (High Sensitivity): 24 ng/L — ABNORMAL HIGH
Troponin I (High Sensitivity): 31 ng/L — ABNORMAL HIGH (ref <18)

## 2023-12-23 LAB — ETHANOL: Alcohol, Ethyl (B): 17 mg/dL — ABNORMAL HIGH (ref <15)

## 2023-12-23 LAB — SALICYLATE LEVEL: Salicylate Lvl: <7 mg/dL — ABNORMAL LOW (ref 7.0–30.0)

## 2023-12-23 MED ORDER — SODIUM CHLORIDE 0.9 % IV BOLUS
1000.0000 mL | Freq: Once | INTRAVENOUS | Status: AC
  Administered 2023-12-23: 1000 mL via INTRAVENOUS

## 2023-12-23 MED ORDER — SODIUM CHLORIDE 0.9 % IV BOLUS
500.0000 mL | Freq: Once | INTRAVENOUS | Status: AC
  Administered 2023-12-23: 500 mL via INTRAVENOUS

## 2023-12-23 MED ORDER — SODIUM CHLORIDE 0.9 % IV SOLN: INTRAVENOUS | Status: DC

## 2023-12-23 NOTE — ED Notes (Signed)
 Pt admits to using cocaine and alcohol

## 2023-12-23 NOTE — ED Triage Notes (Signed)
 [place] [address]. Pt is in police custody.  Pt was combative and would only say that his heart hurts  and he wants water. Pt would not answer any other questions. Pt would not allow them to get any further vitals than hr and bp. Pt is diaphoretic. According to pd pt had seizure activity no post ictal .    Ems vitals  Hr 138 Bp 138/52
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
## 2023-12-23 NOTE — Discharge Instructions (Addendum)
 For pain control you may take 1000 mg of acetaminophen (Tylenol) every 8 hours and/or 600 mg of Ibuprofen (Motrin, Advil, etc.) every 6-8 hours as needed.  Please limit acetaminophen (Tylenol) to 4000 mg and Ibuprofen (Motrin, Advil, etc.) to 2400 mg for a 24hr period. Please note that other over-the-counter medicine may contain acetaminophen or ibuprofen as a component of their ingredients.
